# Patient Record
Sex: Female | Born: 1970 | Race: White | Hispanic: No | Marital: Married | Smoking: Never smoker
Health system: Southern US, Community
[De-identification: ages and names within clinical notes are randomized; demographics above are authoritative.]

## PROBLEM LIST (undated history)

## (undated) DIAGNOSIS — E785 Hyperlipidemia, unspecified: Secondary | ICD-10-CM

## (undated) DIAGNOSIS — I10 Essential (primary) hypertension: Secondary | ICD-10-CM

## (undated) HISTORY — PX: NO PAST SURGERIES: SHX2092

---

## 2004-10-25 ENCOUNTER — Ambulatory Visit: Payer: Self-pay | Admitting: Cardiology

## 2012-03-29 ENCOUNTER — Other Ambulatory Visit: Payer: Self-pay | Admitting: Family Medicine

## 2012-03-29 DIAGNOSIS — R928 Other abnormal and inconclusive findings on diagnostic imaging of breast: Secondary | ICD-10-CM

## 2012-04-10 ENCOUNTER — Inpatient Hospital Stay: Admission: RE | Admit: 2012-04-10 | Payer: Self-pay | Source: Ambulatory Visit

## 2012-04-10 ENCOUNTER — Ambulatory Visit
Admission: RE | Admit: 2012-04-10 | Discharge: 2012-04-10 | Disposition: A | Discharge status: Home / Self Care | Payer: BC Managed Care – PPO | Source: Ambulatory Visit | Attending: Family Medicine | Admitting: Family Medicine

## 2012-09-05 ENCOUNTER — Other Ambulatory Visit: Payer: Self-pay

## 2012-09-05 DIAGNOSIS — Z1231 Encounter for screening mammogram for malignant neoplasm of breast: Secondary | ICD-10-CM

## 2012-10-03 ENCOUNTER — Ambulatory Visit
Admission: RE | Admit: 2012-10-03 | Discharge: 2012-10-03 | Disposition: A | Discharge status: Home / Self Care | Payer: BC Managed Care – PPO | Source: Ambulatory Visit

## 2012-10-03 DIAGNOSIS — Z1231 Encounter for screening mammogram for malignant neoplasm of breast: Secondary | ICD-10-CM

## 2013-08-27 ENCOUNTER — Other Ambulatory Visit: Payer: Self-pay

## 2013-08-27 DIAGNOSIS — Z1231 Encounter for screening mammogram for malignant neoplasm of breast: Secondary | ICD-10-CM

## 2013-10-07 ENCOUNTER — Ambulatory Visit
Admission: RE | Admit: 2013-10-07 | Discharge: 2013-10-07 | Disposition: A | Discharge status: Home / Self Care | Payer: BC Managed Care – PPO | Source: Ambulatory Visit

## 2013-10-07 DIAGNOSIS — Z1231 Encounter for screening mammogram for malignant neoplasm of breast: Secondary | ICD-10-CM

## 2014-08-27 ENCOUNTER — Other Ambulatory Visit: Payer: Self-pay

## 2014-08-27 DIAGNOSIS — Z1231 Encounter for screening mammogram for malignant neoplasm of breast: Secondary | ICD-10-CM

## 2014-10-09 ENCOUNTER — Ambulatory Visit: Payer: BC Managed Care – PPO

## 2014-10-09 ENCOUNTER — Ambulatory Visit
Admission: RE | Admit: 2014-10-09 | Discharge: 2014-10-09 | Disposition: A | Discharge status: Home / Self Care | Payer: BC Managed Care – PPO | Source: Ambulatory Visit

## 2014-10-09 DIAGNOSIS — Z1231 Encounter for screening mammogram for malignant neoplasm of breast: Secondary | ICD-10-CM

## 2015-09-24 ENCOUNTER — Other Ambulatory Visit: Payer: Self-pay | Admitting: Obstetrics and Gynecology

## 2015-09-24 DIAGNOSIS — Z1231 Encounter for screening mammogram for malignant neoplasm of breast: Secondary | ICD-10-CM

## 2015-10-12 ENCOUNTER — Ambulatory Visit
Admission: RE | Admit: 2015-10-12 | Discharge: 2015-10-12 | Disposition: A | Discharge status: Home / Self Care | Payer: BC Managed Care – PPO | Source: Ambulatory Visit | Attending: Obstetrics and Gynecology | Admitting: Obstetrics and Gynecology

## 2015-10-12 DIAGNOSIS — Z1231 Encounter for screening mammogram for malignant neoplasm of breast: Secondary | ICD-10-CM

## 2016-09-14 ENCOUNTER — Other Ambulatory Visit: Payer: Self-pay | Admitting: Obstetrics and Gynecology

## 2016-09-14 DIAGNOSIS — Z1231 Encounter for screening mammogram for malignant neoplasm of breast: Secondary | ICD-10-CM

## 2016-10-14 ENCOUNTER — Ambulatory Visit: Payer: BC Managed Care – PPO

## 2016-10-17 ENCOUNTER — Ambulatory Visit
Admission: RE | Admit: 2016-10-17 | Discharge: 2016-10-17 | Disposition: A | Discharge status: Home / Self Care | Payer: BC Managed Care – PPO | Source: Ambulatory Visit | Attending: Obstetrics and Gynecology | Admitting: Obstetrics and Gynecology

## 2016-10-17 DIAGNOSIS — Z1231 Encounter for screening mammogram for malignant neoplasm of breast: Secondary | ICD-10-CM

## 2017-10-02 ENCOUNTER — Other Ambulatory Visit: Payer: Self-pay | Admitting: Obstetrics and Gynecology

## 2017-10-02 DIAGNOSIS — Z1231 Encounter for screening mammogram for malignant neoplasm of breast: Secondary | ICD-10-CM

## 2017-11-06 ENCOUNTER — Ambulatory Visit
Admission: RE | Admit: 2017-11-06 | Discharge: 2017-11-06 | Disposition: A | Discharge status: Home / Self Care | Payer: BC Managed Care – PPO | Source: Ambulatory Visit | Attending: Obstetrics and Gynecology | Admitting: Obstetrics and Gynecology

## 2017-11-06 DIAGNOSIS — Z1231 Encounter for screening mammogram for malignant neoplasm of breast: Secondary | ICD-10-CM

## 2018-11-12 ENCOUNTER — Other Ambulatory Visit: Payer: Self-pay | Admitting: Obstetrics and Gynecology

## 2018-11-12 DIAGNOSIS — Z1231 Encounter for screening mammogram for malignant neoplasm of breast: Secondary | ICD-10-CM

## 2018-11-15 ENCOUNTER — Other Ambulatory Visit: Payer: Self-pay

## 2018-11-15 ENCOUNTER — Ambulatory Visit
Admission: RE | Admit: 2018-11-15 | Discharge: 2018-11-15 | Disposition: A | Discharge status: Home / Self Care | Payer: BC Managed Care – PPO | Source: Ambulatory Visit | Attending: Obstetrics and Gynecology | Admitting: Obstetrics and Gynecology

## 2018-11-15 DIAGNOSIS — Z1231 Encounter for screening mammogram for malignant neoplasm of breast: Secondary | ICD-10-CM

## 2019-11-18 ENCOUNTER — Other Ambulatory Visit: Payer: Self-pay | Admitting: Obstetrics and Gynecology

## 2019-11-18 DIAGNOSIS — Z1231 Encounter for screening mammogram for malignant neoplasm of breast: Secondary | ICD-10-CM

## 2019-12-02 ENCOUNTER — Ambulatory Visit
Admission: RE | Admit: 2019-12-02 | Discharge: 2019-12-02 | Disposition: A | Discharge status: Home / Self Care | Payer: BC Managed Care – PPO | Source: Ambulatory Visit | Attending: Obstetrics and Gynecology | Admitting: Obstetrics and Gynecology

## 2019-12-02 ENCOUNTER — Other Ambulatory Visit: Payer: Self-pay

## 2019-12-02 DIAGNOSIS — Z1231 Encounter for screening mammogram for malignant neoplasm of breast: Secondary | ICD-10-CM

## 2019-12-05 ENCOUNTER — Other Ambulatory Visit: Payer: Self-pay | Admitting: Obstetrics and Gynecology

## 2019-12-05 DIAGNOSIS — R928 Other abnormal and inconclusive findings on diagnostic imaging of breast: Secondary | ICD-10-CM

## 2019-12-19 ENCOUNTER — Ambulatory Visit
Admission: RE | Admit: 2019-12-19 | Discharge: 2019-12-19 | Disposition: A | Discharge status: Home / Self Care | Payer: BC Managed Care – PPO | Source: Ambulatory Visit | Attending: Obstetrics and Gynecology | Admitting: Obstetrics and Gynecology

## 2019-12-19 ENCOUNTER — Other Ambulatory Visit: Payer: Self-pay

## 2019-12-19 DIAGNOSIS — R928 Other abnormal and inconclusive findings on diagnostic imaging of breast: Secondary | ICD-10-CM

## 2020-02-06 LAB — EXTERNAL GENERIC LAB PROCEDURE: COLOGUARD: NEGATIVE

## 2020-02-06 LAB — COLOGUARD: COLOGUARD: NEGATIVE

## 2020-10-23 ENCOUNTER — Other Ambulatory Visit: Payer: Self-pay | Admitting: Obstetrics and Gynecology

## 2020-10-23 DIAGNOSIS — Z1231 Encounter for screening mammogram for malignant neoplasm of breast: Secondary | ICD-10-CM

## 2020-12-02 ENCOUNTER — Other Ambulatory Visit: Payer: Self-pay

## 2020-12-02 ENCOUNTER — Ambulatory Visit
Admission: RE | Admit: 2020-12-02 | Discharge: 2020-12-02 | Disposition: A | Discharge status: Home / Self Care | Payer: BC Managed Care – PPO | Source: Ambulatory Visit | Attending: Obstetrics and Gynecology | Admitting: Obstetrics and Gynecology

## 2020-12-02 DIAGNOSIS — Z1231 Encounter for screening mammogram for malignant neoplasm of breast: Secondary | ICD-10-CM

## 2021-04-28 ENCOUNTER — Encounter (HOSPITAL_COMMUNITY): Payer: Self-pay | Admitting: Emergency Medicine

## 2021-04-28 ENCOUNTER — Other Ambulatory Visit: Payer: Self-pay

## 2021-04-28 ENCOUNTER — Emergency Department (HOSPITAL_COMMUNITY): Payer: BC Managed Care – PPO

## 2021-04-28 ENCOUNTER — Emergency Department (HOSPITAL_COMMUNITY)
Admission: EM | Admit: 2021-04-28 | Discharge: 2021-04-28 | Disposition: A | Discharge status: Home / Self Care | Payer: BC Managed Care – PPO | Attending: Emergency Medicine | Admitting: Emergency Medicine

## 2021-04-28 DIAGNOSIS — R109 Unspecified abdominal pain: Secondary | ICD-10-CM | POA: Diagnosis not present

## 2021-04-28 DIAGNOSIS — I10 Essential (primary) hypertension: Secondary | ICD-10-CM | POA: Diagnosis not present

## 2021-04-28 DIAGNOSIS — K8050 Calculus of bile duct without cholangitis or cholecystitis without obstruction: Secondary | ICD-10-CM

## 2021-04-28 HISTORY — DX: Essential (primary) hypertension: I10

## 2021-04-28 LAB — COMPREHENSIVE METABOLIC PANEL
ALT: 238 U/L — ABNORMAL HIGH (ref 0–44)
AST: 356 U/L — ABNORMAL HIGH (ref 15–41)
Albumin: 4.6 g/dL (ref 3.5–5.0)
Alkaline Phosphatase: 108 U/L (ref 38–126)
Anion gap: 10 (ref 5–15)
BUN: 16 mg/dL (ref 6–20)
CO2: 26 mmol/L (ref 22–32)
Calcium: 9.3 mg/dL (ref 8.9–10.3)
Chloride: 103 mmol/L (ref 98–111)
Creatinine, Ser: 0.71 mg/dL (ref 0.44–1.00)
GFR, Estimated: >60 mL/min (ref >60)
Glucose, Bld: 104 mg/dL — ABNORMAL HIGH (ref 70–99)
Potassium: 3.6 mmol/L (ref 3.5–5.1)
Sodium: 139 mmol/L (ref 135–145)
Total Bilirubin: 1 mg/dL (ref 0.3–1.2)
Total Protein: 7.4 g/dL (ref 6.5–8.1)

## 2021-04-28 LAB — CBC
HCT: 43.1 % (ref 36.0–46.0)
Hemoglobin: 14.1 g/dL (ref 12.0–15.0)
MCH: 31.5 pg (ref 26.0–34.0)
MCHC: 32.7 g/dL (ref 30.0–36.0)
MCV: 96.2 fL (ref 80.0–100.0)
Platelets: 192 10*3/uL (ref 150–400)
RBC: 4.48 MIL/uL (ref 3.87–5.11)
RDW: 11.5 % (ref 11.5–15.5)
WBC: 10.5 10*3/uL (ref 4.0–10.5)
nRBC: 0 % (ref 0.0–0.2)

## 2021-04-28 LAB — URINALYSIS, ROUTINE W REFLEX MICROSCOPIC
Bilirubin Urine: NEGATIVE
Glucose, UA: NEGATIVE mg/dL
Hgb urine dipstick: NEGATIVE
Ketones, ur: 20 mg/dL — AB
Nitrite: NEGATIVE
Protein, ur: 30 mg/dL — AB
Specific Gravity, Urine: 1.025 (ref 1.005–1.030)
pH: 6 (ref 5.0–8.0)

## 2021-04-28 LAB — LIPASE, BLOOD: Lipase: 39 U/L (ref 11–51)

## 2021-04-28 LAB — PREGNANCY, URINE: Preg Test, Ur: NEGATIVE

## 2021-04-28 NOTE — Discharge Instructions (Signed)
Please read the attached instructions, make sure that you follow-up closely with your doctor and the general surgeon.  Your ultrasound did show that you had a gallstone inside your gallbladder which was probably causing the problem.  It is not causing a problem at this time however if you increase your pain or vomiting after a meal or if it comes on spontaneously return to the ER immediately if it is not going away within a couple of hours ?

## 2021-04-28 NOTE — ED Triage Notes (Signed)
Pt c/o upper abdominal pain that is starting to ease up now. Denies n/v/d ?

## 2021-04-28 NOTE — ED Provider Notes (Signed)
?[facility] ?Provider Note ? ? ?CSN: [id number] ?Arrival date & time: 04/28/21  0141 ? ?  ? ?History ?Chief Complaint  ?Patient presents with  ? Abdominal Pain  ? ? ?Debra Blanchard is a 51 y.o. female. ? ?51 year old female who presents to the emerged from today with abdominal pain.  Patient has history of hyperlipidemia hypertension but no other real medical problems.  She states that she has woken her this morning just prior to arrival with severe burning in her epigastric and bilateral upper quadrants.  This persisted for quite a period of time.  No nausea or vomiting.  No fevers.  No diarrhea or constipation.  She states that she tried to move around it did not help she was almost doubled over that her so bad.  She stated by time she got here and it almost resolved.  While in the waiting room she stated came back but not quite as severe and very quickly and then went away.  She states that she is never relating this severe before.  She had some indigestion that feels nothing like this.  She states that her liver enzymes have been elevated a little bit in the past but she is not sure the numbers.  (Patient was able to get the results sent to her by her son at home and her ALT was slightly elevated at 65 but her AST was normal.) ?No alcohol, drugs or tobacco.  No history of hepatitis, cirrhosis or any other known hepatobiliary disorders. ? ? ?Abdominal Pain ? ?  ? ?Home Medications ?Prior to Admission medications   ?Not on File  ?   ? ?Allergies    ?Patient has no allergy information on record.   ? ?Review of Systems   ?Review of Systems  ?Gastrointestinal:  Positive for abdominal pain.  ? ?Physical Exam ?Updated Vital Signs ?BP 125/80   Pulse (!) 59   Temp 97.6 ?F (36.4 ?C) (Oral)   Resp 18   Ht 5\' 3"  (1.6 m)   Wt 78.9 kg   SpO2 99%   BMI 30.82 kg/m?  ?Physical Exam ?Vitals and nursing note reviewed.  ?Constitutional:   ?   Appearance: She is well-developed.  ?HENT:  ?   Head:  Normocephalic and atraumatic.  ?   Mouth/Throat:  ?   Mouth: Mucous membranes are moist.  ?   Pharynx: Oropharynx is clear.  ?Eyes:  ?   Pupils: Pupils are equal, round, and reactive to light.  ?Cardiovascular:  ?   Rate and Rhythm: Normal rate and regular rhythm.  ?Pulmonary:  ?   Effort: No respiratory distress.  ?   Breath sounds: No stridor.  ?Abdominal:  ?   General: Abdomen is flat. There is no distension.  ?   Tenderness: There is no abdominal tenderness. There is no rebound.  ?   Hernia: No hernia is present.  ?Musculoskeletal:  ?   Cervical back: Normal range of motion.  ?Skin: ?   General: Skin is warm and dry.  ?Neurological:  ?   General: No focal deficit present.  ?   Mental Status: She is alert.  ? ? ?ED Results / Procedures / Treatments   ?Labs ?(all labs ordered are listed, but only abnormal results are displayed) ?Labs Reviewed  ?COMPREHENSIVE METABOLIC PANEL - Abnormal; Notable for the following components:  ?    Result Value  ? Glucose, Bld 104 (*)   ? AST 356 (*)   ? ALT 238 (*)   ?  All other components within normal limits  ?URINALYSIS, ROUTINE W REFLEX MICROSCOPIC - Abnormal; Notable for the following components:  ? Ketones, ur 20 (*)   ? Protein, ur 30 (*)   ? Leukocytes,Ua SMALL (*)   ? Bacteria, UA RARE (*)   ? All other components within normal limits  ?LIPASE, BLOOD  ?CBC  ?PREGNANCY, URINE  ?HEPATITIS PANEL, ACUTE  ? ? ?EKG ?None ? ?Radiology ?No results found. ? ?Procedures ?Procedures  ? ? ?Medications Ordered in ED ?Medications - No data to display ? ?ED Course/ Medical Decision Making/ A&P ?  ?                        ?Medical Decision Making ?Amount and/or Complexity of Data Reviewed ?Labs: ordered. ?Radiology: ordered. ? ? ?Liver enzymes are quite a bit elevated compared to her baseline.  She is asymptomatic at this time however I am worried little bit about her gallbladder and liver so we will get an ultrasound. ? ?Care transferred pendign [place]. Likely d/c ?  ?Final Clinical  Impression(s) / ED Diagnoses ?Final diagnoses:  ?Abdominal pain  ? ? ?Rx / DC Orders ?ED Discharge Orders   ? ? None  ? ?  ? ? ?  ?Merrily Pew, MD ?04/28/21 [phone] ? ?

## 2021-04-28 NOTE — ED Provider Notes (Signed)
This patient is a well-appearing 51 year old female, no prior history of significant liver abnormalities however she presents with upper abdominal pain which came on acutely overnight, since has completely resolved and she is asymptomatic at this time.  Her laboratory work-up however according to my evaluation and interpretation personally shows that she has a transaminitis of an unknown cause, her abdominal exam is completely benign for me and she has totally no tenderness at all.  Ultrasound is pending at this time, hepatitis panel is pending, labs are otherwise reassuring, I anticipate discharge. ? ?The patient has been pain-free, there is evidence of cholelithiasis but no cholecystitis, there is some gallbladder sludge, the patient will be referred to local general surgery, she is agreeable ?  ?Eber Hong, MD ?04/28/21 [phone] ? ?

## 2021-04-29 LAB — HEPATITIS PANEL, ACUTE

## 2021-04-29 LAB — HCV INTERPRETATION

## 2021-05-11 ENCOUNTER — Ambulatory Visit: Payer: BC Managed Care – PPO | Admitting: General Surgery

## 2021-05-11 ENCOUNTER — Other Ambulatory Visit: Payer: Self-pay

## 2021-05-11 ENCOUNTER — Encounter: Payer: Self-pay | Admitting: General Surgery

## 2021-05-11 VITALS — BP 147/87 | HR 48 | Temp 98.2°F | Resp 12 | Ht 63.0 in | Wt 170.0 lb

## 2021-05-11 DIAGNOSIS — K8000 Calculus of gallbladder with acute cholecystitis without obstruction: Secondary | ICD-10-CM

## 2021-05-11 NOTE — H&P (Signed)
Debra Blanchard; [id number]; 10/30/1970 ? ? ?HPI ?Patient is a 51-year-old white female who was referred to my care by Dr. Terry Daniel in the emergency room for evaluation and treatment of acute cholecystitis secondary to cholelithiasis.  Patient was seen in the emergency room on 04/28/2021 for right upper quadrant abdominal pain and nausea.  Ultrasound the gallbladder revealed cholelithiasis, the 7 mm gallbladder polyp, and pericholecystic fluid.  Her common bile duct was 7 mm.  Her liver enzyme tests were noted to be elevated in the 2-3 100s.  Patient states that she was recently switched her high cholesterol medication due to mild elevation in her LFTs.  She states she has had intermittent right upper quadrant abdominal pain over the past few months.  Is not necessarily associated with food.  She currently has been mild indigestion and right upper quadrant discomfort which is variable.  She denies any fever, chills, or jaundice. ?Past Medical History:  ?Diagnosis Date  ? Hypertension   ? ? ?History reviewed. No pertinent surgical history. ? ?Family History  ?Problem Relation Age of Onset  ? Breast cancer Neg Hx   ? ? ?Current Outpatient Medications on File Prior to Visit  ?Medication Sig Dispense Refill  ? ezetimibe (ZETIA) 10 MG tablet Take 10 mg by mouth daily.    ? metoprolol succinate (TOPROL-XL) 50 MG 24 hr tablet Take by mouth.    ? ?No current facility-administered medications on file prior to visit.  ? ? ?No Known Allergies ? ?Social History  ? ?Substance and Sexual Activity  ?Alcohol Use Never  ? ? ?Social History  ? ?Tobacco Use  ?Smoking Status Never  ?Smokeless Tobacco Never  ? ? ?Review of Systems  ?Constitutional: Negative.   ?HENT: Negative.    ?Eyes:  Positive for pain.  ?Gastrointestinal:  Positive for abdominal pain.  ?Genitourinary: Negative.   ?Musculoskeletal:  Positive for back pain.  ?Skin: Negative.   ?Neurological: Negative.   ?Endo/Heme/Allergies: Negative.   ?Psychiatric/Behavioral:  Negative.    ? ?Objective  ? ?Vitals:  ? 05/11/21 1535  ?BP: (!) 147/87  ?Pulse: (!) 48  ?Resp: 12  ?Temp: 98.2 ?F (36.8 ?C)  ?SpO2: 97%  ? ? ?Physical Exam ?Vitals reviewed.  ?Constitutional:   ?   Appearance: Normal appearance. She is not ill-appearing.  ?HENT:  ?   Head: Normocephalic and atraumatic.  ?Eyes:  ?   General: No scleral icterus. ?Cardiovascular:  ?   Rate and Rhythm: Normal rate and regular rhythm.  ?   Heart sounds: Normal heart sounds. No murmur heard. ?  No friction rub. No gallop.  ?Pulmonary:  ?   Effort: Pulmonary effort is normal. No respiratory distress.  ?   Breath sounds: No stridor. No wheezing, rhonchi or rales.  ?Abdominal:  ?   General: Bowel sounds are normal. There is no distension.  ?   Palpations: Abdomen is soft. There is no mass.  ?   Tenderness: There is abdominal tenderness. There is no guarding or rebound.  ?   Hernia: No hernia is present.  ?   Comments: Tender in the right upper quadrant to deep palpation.  No rigidity is noted.  ?Skin: ?   General: Skin is warm and dry.  ?Neurological:  ?   Mental Status: She is alert and oriented to person, place, and time.  ? ?ER notes, ultrasound report, and labs reviewed ?Assessment  ?Acute cholecystitis secondary to cholelithiasis ?Plan  ?Patient is scheduled for laparoscopic cholecystectomy on 05/17/2021.  The   risks and benefits of the procedure including bleeding, infection, hepatobiliary injury, and the possibility of an open procedure were fully explained to the patient, who gave informed consent. ?

## 2021-05-11 NOTE — Progress Notes (Signed)
Debra Blanchard; [id number]; 1970-03-07 ? ? ?HPI ?Patient is a 51 year old white female who was referred to my care by Dr. Donzetta Sprung in the emergency room for evaluation and treatment of acute cholecystitis secondary to cholelithiasis.  Patient was seen in the emergency room on 04/28/2021 for right upper quadrant abdominal pain and nausea.  Ultrasound the gallbladder revealed cholelithiasis, the 7 mm gallbladder polyp, and pericholecystic fluid.  Her common bile duct was 7 mm.  Her liver enzyme tests were noted to be elevated in the 2-3 100s.  Patient states that she was recently switched her high cholesterol medication due to mild elevation in her LFTs.  She states she has had intermittent right upper quadrant abdominal pain over the past few months.  Is not necessarily associated with food.  She currently has been mild indigestion and right upper quadrant discomfort which is variable.  She denies any fever, chills, or jaundice. ?Past Medical History:  ?Diagnosis Date  ? Hypertension   ? ? ?History reviewed. No pertinent surgical history. ? ?Family History  ?Problem Relation Age of Onset  ? Breast cancer Neg Hx   ? ? ?Current Outpatient Medications on File Prior to Visit  ?Medication Sig Dispense Refill  ? ezetimibe (ZETIA) 10 MG tablet Take 10 mg by mouth daily.    ? metoprolol succinate (TOPROL-XL) 50 MG 24 hr tablet Take by mouth.    ? ?No current facility-administered medications on file prior to visit.  ? ? ?No Known Allergies ? ?Social History  ? ?Substance and Sexual Activity  ?Alcohol Use Never  ? ? ?Social History  ? ?Tobacco Use  ?Smoking Status Never  ?Smokeless Tobacco Never  ? ? ?Review of Systems  ?Constitutional: Negative.   ?HENT: Negative.    ?Eyes:  Positive for pain.  ?Gastrointestinal:  Positive for abdominal pain.  ?Genitourinary: Negative.   ?Musculoskeletal:  Positive for back pain.  ?Skin: Negative.   ?Neurological: Negative.   ?Endo/Heme/Allergies: Negative.   ?Psychiatric/Behavioral:  Negative.    ? ?Objective  ? ?Vitals:  ? 05/11/21 1535  ?BP: (!) 147/87  ?Pulse: (!) 48  ?Resp: 12  ?Temp: 98.2 ?F (36.8 ?C)  ?SpO2: 97%  ? ? ?Physical Exam ?Vitals reviewed.  ?Constitutional:   ?   Appearance: Normal appearance. She is not ill-appearing.  ?HENT:  ?   Head: Normocephalic and atraumatic.  ?Eyes:  ?   General: No scleral icterus. ?Cardiovascular:  ?   Rate and Rhythm: Normal rate and regular rhythm.  ?   Heart sounds: Normal heart sounds. No murmur heard. ?  No friction rub. No gallop.  ?Pulmonary:  ?   Effort: Pulmonary effort is normal. No respiratory distress.  ?   Breath sounds: No stridor. No wheezing, rhonchi or rales.  ?Abdominal:  ?   General: Bowel sounds are normal. There is no distension.  ?   Palpations: Abdomen is soft. There is no mass.  ?   Tenderness: There is abdominal tenderness. There is no guarding or rebound.  ?   Hernia: No hernia is present.  ?   Comments: Tender in the right upper quadrant to deep palpation.  No rigidity is noted.  ?Skin: ?   General: Skin is warm and dry.  ?Neurological:  ?   Mental Status: She is alert and oriented to person, place, and time.  ? ?ER notes, ultrasound report, and labs reviewed ?Assessment  ?Acute cholecystitis secondary to cholelithiasis ?Plan  ?Patient is scheduled for laparoscopic cholecystectomy on 05/17/2021.  The  risks and benefits of the procedure including bleeding, infection, hepatobiliary injury, and the possibility of an open procedure were fully explained to the patient, who gave informed consent. ?

## 2021-05-12 ENCOUNTER — Encounter (HOSPITAL_COMMUNITY): Payer: Self-pay

## 2021-05-12 NOTE — Patient Instructions (Signed)
? ? ASHELI MATYAS ? 05/12/2021  ?  ? @PREFPERIOPPHARMACY @ ? ? Your procedure is scheduled on 05/17/2021. ? Report to [facility] at 6:00 A.M. ? Call this number if you have problems the morning of surgery: ? [phone] ? ? Remember: ? Do not eat or drink after midnight. ?  ?  ? Take these medicines the morning of surgery with A SIP OF WATER : Metoprolol ?  ? Do not wear jewelry, make-up or nail polish. ? Do not wear lotions, powders, or perfumes, or deodorant. ? Do not shave 48 hours prior to surgery.  Men may shave face and neck. ? Do not bring valuables to the hospital. ? [facility] is not responsible for any belongings or valuables. ? ?Contacts, dentures or bridgework may not be worn into surgery.  Leave your suitcase in the car.  After surgery it may be brought to your room. ? ?For patients admitted to the hospital, discharge time will be determined by your treatment team. ? ?Patients discharged the day of surgery will not be allowed to drive home.  ? ?Name and phone number of your driver:   Family ?Special instructions:  N/A ? ?Please read over the following fact sheets that you were given. ?Care and Recovery After Surgery ? ? ? ?Minimally Invasive Cholecystectomy ?Minimally invasive cholecystectomy is surgery to remove the gallbladder. The gallbladder is a pear-shaped organ that lies beneath the liver on the right side of the body. The gallbladder stores bile, which is a fluid that helps the body digest fats. Cholecystectomy is often done to treat inflammation (irritation and swelling) of the gallbladder (cholecystitis). This condition is usually caused by a buildup of gallstones (cholelithiasis) in the gallbladder or when the fluid in the gall bladder becomes stagnant because gallstones get stuck in the ducts (tubes) and block the flow of bile. This can result in inflammation and pain. In severe cases, emergency surgery may be required. ?This procedure is done through small incisions in the abdomen,  instead of one large incision. It is also called laparoscopic surgery. A thin scope with a camera (laparoscope) is inserted through one incision. Then surgical instruments are inserted through the other incisions. In some cases, a minimally invasive surgery may need to be changed to a surgery that is done through a larger incision. This is called open surgery. ?Tell a health care provider about: ?Any allergies you have. ?All medicines you are taking, including vitamins, herbs, eye drops, creams, and over-the-counter medicines. ?Any problems you or family members have had with anesthetic medicines. ?Any bleeding problems you have. ?Any surgeries you have had. ?Any medical conditions you have. ?Whether you are pregnant or may be pregnant. ?What are the risks? ?Generally, this is a safe procedure. However, problems may occur, including: ?Infection. ?Bleeding. ?Allergic reactions to medicines. ?Damage to nearby structures or organs. ?A gallstone remaining in the common bile duct. The common bile duct carries bile from the gallbladder to the small intestine. ?A bile leak from the liver or cystic duct after your gallbladder is removed. ?What happens before the procedure? ?When to stop eating and drinking ?Follow instructions from your health care provider about what you may eat and drink before your procedure. These may include: ?8 hours before the procedure ?Stop eating most foods. Do not eat meat, fried foods, or fatty foods. ?Eat only light foods, such as toast or crackers. ?All liquids are okay except energy drinks and alcohol. ?6 hours before the procedure ?Stop eating. ?Drink only clear  liquids, such as water, clear fruit juice, black coffee, plain tea, and sports drinks. ?Do not drink energy drinks or alcohol. ?2 hours before the procedure ?Stop drinking all liquids. ?You may be allowed to take medicines with small sips of water. ?If you do not follow your health care provider's instructions, your procedure may be  delayed or canceled. ?Medicines ?Ask your health care provider about: ?Changing or stopping your regular medicines. This is especially important if you are taking diabetes medicines or blood thinners. ?Taking medicines such as aspirin and ibuprofen. These medicines can thin your blood. Do not take these medicines unless your health care provider tells you to take them. ?Taking over-the-counter medicines, vitamins, herbs, and supplements. ?General instructions ?If you will be going home right after the procedure, plan to have a responsible adult: ?Take you home from the hospital or clinic. You will not be allowed to drive. ?Care for you for the time you are told. ?Do not use any products that contain nicotine or tobacco for at least 4 weeks before the procedure. These products include cigarettes, chewing tobacco, and vaping devices, such as e-cigarettes. If you need help quitting, ask your health care provider. ?Ask your health care provider: ?How your surgery site will be marked. ?What steps will be taken to help prevent infection. These may include: ?Removing hair at the surgery site. ?Washing skin with a germ-killing soap. ?Taking antibiotic medicine. ?What happens during the procedure? ? ?An IV will be inserted into one of your veins. ?You will be given one or both of the following: ?A medicine to help you relax (sedative). ?A medicine to make you fall asleep (general anesthetic). ?Your surgeon will make several small incisions in your abdomen. ?The laparoscope will be inserted through one of the small incisions. The camera on the laparoscope will send images to a monitor in the operating room. This lets your surgeon see inside your abdomen. ?A gas will be pumped into your abdomen. This will expand your abdomen to give the surgeon more room to perform the surgery. ?Other tools that are needed for the procedure will be inserted through the other incisions. The gallbladder will be removed through one of the  incisions. ?Your common bile duct may be examined. If stones are found in the common bile duct, they may be removed. ?After your gallbladder has been removed, the incisions will be closed with stitches (sutures), staples, or skin glue. ?Your incisions will be covered with a bandage (dressing). ?The procedure may vary among health care providers and hospitals. ?What happens after the procedure? ?Your blood pressure, heart rate, breathing rate, and blood oxygen level will be monitored until you leave the hospital or clinic. ?You will be given medicines as needed to control your pain. ?You may have a drain placed in the incision. The drain will be removed a day or two after the procedure. ?Summary ?Minimally invasive cholecystectomy, also called laparoscopic cholecystectomy, is surgery to remove the gallbladder using small incisions. ?Tell your health care provider about all the medical conditions you have and all the medicines you are taking for those conditions. ?Before the procedure, follow instructions about when to stop eating and drinking and changing or stopping medicines. ?Plan to have a responsible adult care for you for the time you are told after you leave the hospital or clinic. ?This information is not intended to replace advice given to you by your health care provider. Make sure you discuss any questions you have with your health care provider. ?Document  Revised: 08/18/2020 Document Reviewed: 08/18/2020 ?Elsevier Patient Education ? [place]. ? ?General Anesthesia, Adult ?General anesthesia is the use of medicines to make a person "go to sleep" (unconscious) for a medical procedure. General anesthesia must be used for certain procedures, and is often recommended for procedures that: ?Last a long time. ?Require you to be still or in an unusual position. ?Are major and can cause blood loss. ?The medicines used for general anesthesia are called general anesthetics. As well as making you unconscious  for a certain amount of time, these medicines: ?Prevent pain. ?Control your blood pressure. ?Relax your muscles. ?Tell a health care provider about: ?Any allergies you have. ?All medicines you are taking, inclu

## 2021-05-13 ENCOUNTER — Encounter (HOSPITAL_COMMUNITY)
Admission: RE | Admit: 2021-05-13 | Discharge: 2021-05-13 | Disposition: A | Discharge status: Home / Self Care | Payer: BC Managed Care – PPO | Source: Ambulatory Visit | Attending: General Surgery | Admitting: General Surgery

## 2021-05-13 ENCOUNTER — Encounter (HOSPITAL_COMMUNITY): Payer: Self-pay | Admitting: *Deleted

## 2021-05-13 HISTORY — DX: Hyperlipidemia, unspecified: E78.5

## 2021-05-17 ENCOUNTER — Other Ambulatory Visit: Payer: Self-pay

## 2021-05-17 ENCOUNTER — Encounter (HOSPITAL_COMMUNITY): Payer: Self-pay | Admitting: General Surgery

## 2021-05-17 ENCOUNTER — Encounter (HOSPITAL_COMMUNITY)
Admission: RE | Disposition: A | Discharge status: Home / Self Care | Payer: Self-pay | Source: Home / Self Care | Attending: General Surgery

## 2021-05-17 ENCOUNTER — Ambulatory Visit (HOSPITAL_COMMUNITY): Payer: BC Managed Care – PPO | Admitting: Anesthesiology

## 2021-05-17 ENCOUNTER — Ambulatory Visit (HOSPITAL_COMMUNITY)
Admission: RE | Admit: 2021-05-17 | Discharge: 2021-05-17 | Disposition: A | Discharge status: Home / Self Care | Payer: BC Managed Care – PPO | Attending: General Surgery | Admitting: General Surgery

## 2021-05-17 DIAGNOSIS — I10 Essential (primary) hypertension: Secondary | ICD-10-CM | POA: Insufficient documentation

## 2021-05-17 DIAGNOSIS — K8012 Calculus of gallbladder with acute and chronic cholecystitis without obstruction: Secondary | ICD-10-CM | POA: Insufficient documentation

## 2021-05-17 DIAGNOSIS — K8020 Calculus of gallbladder without cholecystitis without obstruction: Secondary | ICD-10-CM

## 2021-05-17 DIAGNOSIS — E7800 Pure hypercholesterolemia, unspecified: Secondary | ICD-10-CM | POA: Insufficient documentation

## 2021-05-17 DIAGNOSIS — R7401 Elevation of levels of liver transaminase levels: Secondary | ICD-10-CM | POA: Diagnosis not present

## 2021-05-17 HISTORY — PX: LIVER BIOPSY: SHX301

## 2021-05-17 HISTORY — PX: CHOLECYSTECTOMY: SHX55

## 2021-05-17 SURGERY — LAPAROSCOPIC CHOLECYSTECTOMY
Anesthesia: General | Site: Abdomen

## 2021-05-17 MED ORDER — LACTATED RINGERS IV SOLN: INTRAVENOUS | Status: DC

## 2021-05-17 MED ORDER — LIDOCAINE HCL (PF) 2 % IJ SOLN
INTRAMUSCULAR | Status: AC
  Filled 2021-05-17: qty 5

## 2021-05-17 MED ORDER — ORAL CARE MOUTH RINSE: 15.0000 mL | Freq: Once | OROMUCOSAL | Status: AC

## 2021-05-17 MED ORDER — MIDAZOLAM HCL 2 MG/2ML IJ SOLN
INTRAMUSCULAR | Status: DC | PRN
Start: 2021-05-17 — End: 2021-05-17
  Administered 2021-05-17: 2 mg via INTRAVENOUS

## 2021-05-17 MED ORDER — ONDANSETRON HCL 4 MG/2ML IJ SOLN
INTRAMUSCULAR | Status: AC
  Filled 2021-05-17: qty 2

## 2021-05-17 MED ORDER — ROCURONIUM BROMIDE 10 MG/ML (PF) SYRINGE
PREFILLED_SYRINGE | INTRAVENOUS | Status: AC
  Filled 2021-05-17: qty 10

## 2021-05-17 MED ORDER — KETOROLAC TROMETHAMINE 30 MG/ML IJ SOLN
30.0000 mg | Freq: Once | INTRAMUSCULAR | Status: AC
  Administered 2021-05-17: 30 mg via INTRAVENOUS
  Filled 2021-05-17: qty 1

## 2021-05-17 MED ORDER — HEMOSTATIC AGENTS (NO CHARGE) OPTIME
TOPICAL | Status: DC | PRN
Start: 2021-05-17 — End: 2021-05-17
  Administered 2021-05-17: 1 via TOPICAL

## 2021-05-17 MED ORDER — HYDROMORPHONE HCL 1 MG/ML IJ SOLN
0.2500 mg | INTRAMUSCULAR | Status: DC | PRN
  Administered 2021-05-17: 0.5 mg via INTRAVENOUS
  Filled 2021-05-17: qty 0.5

## 2021-05-17 MED ORDER — BUPIVACAINE LIPOSOME 1.3 % IJ SUSP
INTRAMUSCULAR | Status: DC | PRN
  Administered 2021-05-17: 20 mL

## 2021-05-17 MED ORDER — SODIUM CHLORIDE 0.9 % IR SOLN
Status: DC | PRN
  Administered 2021-05-17: 1000 mL

## 2021-05-17 MED ORDER — SCOPOLAMINE 1 MG/3DAYS TD PT72
MEDICATED_PATCH | TRANSDERMAL | Status: AC
  Filled 2021-05-17: qty 1

## 2021-05-17 MED ORDER — CHLORHEXIDINE GLUCONATE CLOTH 2 % EX PADS: 6.0000 | MEDICATED_PAD | Freq: Once | CUTANEOUS | Status: DC

## 2021-05-17 MED ORDER — PROPOFOL 10 MG/ML IV BOLUS
INTRAVENOUS | Status: AC
  Filled 2021-05-17: qty 20

## 2021-05-17 MED ORDER — LIDOCAINE HCL (CARDIAC) PF 50 MG/5ML IV SOSY
PREFILLED_SYRINGE | INTRAVENOUS | Status: DC | PRN
Start: 2021-05-17 — End: 2021-05-17
  Administered 2021-05-17: 80 mg via INTRAVENOUS

## 2021-05-17 MED ORDER — GLYCOPYRROLATE PF 0.2 MG/ML IJ SOSY
PREFILLED_SYRINGE | INTRAMUSCULAR | Status: AC
  Filled 2021-05-17: qty 1

## 2021-05-17 MED ORDER — MEPERIDINE HCL 50 MG/ML IJ SOLN: 6.2500 mg | INTRAMUSCULAR | Status: DC | PRN

## 2021-05-17 MED ORDER — OXYCODONE HCL 5 MG PO TABS: 5.0000 mg | ORAL_TABLET | ORAL | 0 refills | Status: DC | PRN

## 2021-05-17 MED ORDER — CEFAZOLIN SODIUM-DEXTROSE 2-4 GM/100ML-% IV SOLN
2.0000 g | INTRAVENOUS | Status: AC
  Administered 2021-05-17: 2 g via INTRAVENOUS

## 2021-05-17 MED ORDER — CHLORHEXIDINE GLUCONATE 0.12 % MT SOLN
15.0000 mL | Freq: Once | OROMUCOSAL | Status: AC
  Administered 2021-05-17: 15 mL via OROMUCOSAL

## 2021-05-17 MED ORDER — ONDANSETRON HCL 4 MG/2ML IJ SOLN
INTRAMUSCULAR | Status: DC | PRN
  Administered 2021-05-17: 4 mg via INTRAVENOUS

## 2021-05-17 MED ORDER — MIDAZOLAM HCL 2 MG/2ML IJ SOLN
INTRAMUSCULAR | Status: AC
  Filled 2021-05-17: qty 2

## 2021-05-17 MED ORDER — ONDANSETRON HCL 4 MG/2ML IJ SOLN: 4.0000 mg | Freq: Once | INTRAMUSCULAR | Status: DC | PRN

## 2021-05-17 MED ORDER — DEXAMETHASONE SODIUM PHOSPHATE 10 MG/ML IJ SOLN
INTRAMUSCULAR | Status: DC | PRN
Start: 2021-05-17 — End: 2021-05-17
  Administered 2021-05-17: 5 mg via INTRAVENOUS

## 2021-05-17 MED ORDER — CEFAZOLIN SODIUM-DEXTROSE 2-4 GM/100ML-% IV SOLN
INTRAVENOUS | Status: AC
  Filled 2021-05-17: qty 100

## 2021-05-17 MED ORDER — FENTANYL CITRATE (PF) 250 MCG/5ML IJ SOLN
INTRAMUSCULAR | Status: AC
  Filled 2021-05-17: qty 5

## 2021-05-17 MED ORDER — SUGAMMADEX SODIUM 500 MG/5ML IV SOLN
INTRAVENOUS | Status: DC | PRN
Start: 2021-05-17 — End: 2021-05-17
  Administered 2021-05-17: 200 mg via INTRAVENOUS

## 2021-05-17 MED ORDER — DEXAMETHASONE SODIUM PHOSPHATE 10 MG/ML IJ SOLN
INTRAMUSCULAR | Status: AC
  Filled 2021-05-17: qty 1

## 2021-05-17 MED ORDER — SCOPOLAMINE 1 MG/3DAYS TD PT72
1.0000 | MEDICATED_PATCH | Freq: Once | TRANSDERMAL | Status: DC
  Administered 2021-05-17: 1.5 mg via TRANSDERMAL

## 2021-05-17 MED ORDER — FENTANYL CITRATE (PF) 100 MCG/2ML IJ SOLN
INTRAMUSCULAR | Status: DC | PRN
  Administered 2021-05-17 (×3): 50 ug via INTRAVENOUS

## 2021-05-17 MED ORDER — ROCURONIUM BROMIDE 100 MG/10ML IV SOLN
INTRAVENOUS | Status: DC | PRN
  Administered 2021-05-17: 50 mg via INTRAVENOUS

## 2021-05-17 MED ORDER — GLYCOPYRROLATE 0.2 MG/ML IJ SOLN
INTRAMUSCULAR | Status: DC | PRN
  Administered 2021-05-17: .2 mg via INTRAVENOUS

## 2021-05-17 MED ORDER — PROPOFOL 10 MG/ML IV BOLUS
INTRAVENOUS | Status: DC | PRN
Start: 2021-05-17 — End: 2021-05-17
  Administered 2021-05-17: 150 mg via INTRAVENOUS

## 2021-05-17 MED ORDER — BUPIVACAINE LIPOSOME 1.3 % IJ SUSP
INTRAMUSCULAR | Status: AC
  Filled 2021-05-17: qty 20

## 2021-05-17 SURGICAL SUPPLY — 51 items
ADH SKN CLS APL DERMABOND .7 (GAUZE/BANDAGES/DRESSINGS) ×1
APL PRP STRL LF DISP 70% ISPRP (MISCELLANEOUS) ×1
APL SRG 38 LTWT LNG FL B (MISCELLANEOUS)
APPLICATOR ARISTA FLEXITIP XL (MISCELLANEOUS) IMPLANT
APPLIER CLIP ROT 10 11.4 M/L (STAPLE) ×2
APR CLP MED LRG 11.4X10 (STAPLE) ×1
BAG RETRIEVAL 10 (BASKET) ×1
CHLORAPREP W/TINT 26 (MISCELLANEOUS) ×2 IMPLANT
CLIP APPLIE ROT 10 11.4 M/L (STAPLE) ×1 IMPLANT
CLOTH BEACON ORANGE TIMEOUT ST (SAFETY) ×2 IMPLANT
COVER LIGHT HANDLE STERIS (MISCELLANEOUS) ×4 IMPLANT
DERMABOND ADVANCED (GAUZE/BANDAGES/DRESSINGS) ×1
DERMABOND ADVANCED .7 DNX12 (GAUZE/BANDAGES/DRESSINGS) ×1 IMPLANT
ELECT REM PT RETURN 9FT ADLT (ELECTROSURGICAL) ×2
ELECTRODE REM PT RTRN 9FT ADLT (ELECTROSURGICAL) ×1 IMPLANT
GAUZE 4X4 16PLY ~~LOC~~+RFID DBL (SPONGE) ×2 IMPLANT
GLOVE SURG LTX SZ6.5 (GLOVE) ×1 IMPLANT
GLOVE SURG POLYISO LF SZ6.5 (GLOVE) ×1 IMPLANT
GLOVE SURG POLYISO LF SZ7.5 (GLOVE) ×2 IMPLANT
GLOVE SURG UNDER POLY LF SZ7 (GLOVE) ×5 IMPLANT
GOWN STRL REUS W/TWL LRG LVL3 (GOWN DISPOSABLE) ×8 IMPLANT
HEMOSTAT ARISTA ABSORB 3G PWDR (HEMOSTASIS) IMPLANT
HEMOSTAT SNOW SURGICEL 2X4 (HEMOSTASIS) ×2 IMPLANT
INST SET LAPROSCOPIC AP (KITS) ×2 IMPLANT
KIT TURNOVER KIT A (KITS) ×2 IMPLANT
MANIFOLD NEPTUNE II (INSTRUMENTS) ×2 IMPLANT
NDL BIOPSY 14GX4.5 SOFT TIS (NEEDLE) IMPLANT
NDL HYPO 18GX1.5 BLUNT FILL (NEEDLE) ×1 IMPLANT
NDL HYPO 21X1.5 SAFETY (NEEDLE) ×1 IMPLANT
NDL INSUFFLATION 14GA 120MM (NEEDLE) ×1 IMPLANT
NEEDLE BIOPSY 14GX4.5 SOFT TIS (NEEDLE) ×2 IMPLANT
NEEDLE HYPO 18GX1.5 BLUNT FILL (NEEDLE) ×2 IMPLANT
NEEDLE HYPO 21X1.5 SAFETY (NEEDLE) ×2 IMPLANT
NEEDLE INSUFFLATION 14GA 120MM (NEEDLE) ×2 IMPLANT
NS IRRIG 1000ML POUR BTL (IV SOLUTION) ×2 IMPLANT
PACK LAP CHOLE LZT030E (CUSTOM PROCEDURE TRAY) ×2 IMPLANT
PAD ARMBOARD 7.5X6 YLW CONV (MISCELLANEOUS) ×2 IMPLANT
PAD TELFA 3X4 1S STER (GAUZE/BANDAGES/DRESSINGS) ×1 IMPLANT
SET BASIN LINEN APH (SET/KITS/TRAYS/PACK) ×2 IMPLANT
SET TUBE SMOKE EVAC HIGH FLOW (TUBING) ×2 IMPLANT
SLEEVE ENDOPATH XCEL 5M (ENDOMECHANICALS) ×2 IMPLANT
SUT MNCRL AB 4-0 PS2 18 (SUTURE) ×4 IMPLANT
SUT VICRYL 0 UR6 27IN ABS (SUTURE) ×2 IMPLANT
SYR 20ML LL LF (SYRINGE) ×4 IMPLANT
SYS BAG RETRIEVAL 10MM (BASKET) ×1
SYSTEM BAG RETRIEVAL 10MM (BASKET) ×1 IMPLANT
TROCAR ENDO BLADELESS 11MM (ENDOMECHANICALS) ×2 IMPLANT
TROCAR XCEL NON-BLD 5MMX100MML (ENDOMECHANICALS) ×2 IMPLANT
TROCAR XCEL UNIV SLVE 11M 100M (ENDOMECHANICALS) ×2 IMPLANT
TUBE CONNECTING 12X1/4 (SUCTIONS) ×2 IMPLANT
WARMER LAPAROSCOPE (MISCELLANEOUS) ×2 IMPLANT

## 2021-05-17 NOTE — Anesthesia Postprocedure Evaluation (Signed)
Anesthesia Post Note ? ?Patient: Debra Blanchard ? ?Procedure(s) Performed: LAPAROSCOPIC CHOLECYSTECTOMY (Abdomen) ?LIVER BIOPSY (Abdomen) ? ?Patient location during evaluation: Phase II ?Anesthesia Type: General ?Level of consciousness: awake and alert and oriented ?Pain management: pain level controlled ?Vital Signs Assessment: post-procedure vital signs reviewed and stable ?Respiratory status: spontaneous breathing, nonlabored ventilation and respiratory function stable ?Cardiovascular status: blood pressure returned to baseline and stable ?Postop Assessment: no apparent nausea or vomiting ?Anesthetic complications: no ? ? ?No notable events documented. ? ? ?Last Vitals:  ?Vitals:  ? 05/17/21 0850 05/17/21 0948  ?BP:  125/68  ?Pulse:  60  ?Resp: (!) 9 14  ?Temp:  (!) 36.4 ?C  ?SpO2:  95%  ?  ?Last Pain:  ?Vitals:  ? 05/17/21 0948  ?PainSc: 1   ? ? ?  ?  ?  ?  ?  ?  ? ?Jahking Lesser C Bethanie Bloxom ? ? ? ? ?

## 2021-05-17 NOTE — Op Note (Signed)
Patient:  Debra Blanchard ? ?DOB:  [birth year] ? ?MRN:  [id number] ? ? ?Preop Diagnosis: Biliary colic, cholelithiasis, transaminitis ? ?Postop Diagnosis: Same ? ?Procedure: Laparoscopic cholecystectomy, liver biopsy ? ?Surgeon: Aviva Signs, MD ? ?Anes: General endotracheal ? ?Indications: Patient is a 51 year old white female who presents with biliary colic secondary to cholelithiasis and transaminitis.  She has been on cholesterol medication which has recently been switched.  The risks and benefits of the procedure including bleeding, infection, hepatobiliary injury, the possibility of an open procedure were fully explained to the patient, who gave informed consent. ? ?Procedure note: The patient was placed in the supine position.  After induction of general endotracheal anesthesia, the abdomen was prepped and draped using the usual sterile technique with ChloraPrep.  Surgical site confirmation was performed. ? ?A supraumbilical incision was made down to the fascia.  A Veress needle was introduced into the abdominal cavity and confirmation of placement was done using the saline drop test.  The abdomen was then insufflated to 15 mmHg pressure.  11 mm trocar was introduced into the abdominal cavity under direct visualization without difficulty.  The patient was placed in reverse Trendelenburg position and an additional 11 mm trocar was placed in the epigastric region and 5 mm trocars were placed in the right upper quadrant and right flank regions.  Liver was inspected and noted to be within normal limits.  A Tru-Cut needle biopsy of the right lobe of the liver was made and the specimen was sent to pathology for further examination.  The gallbladder was retracted in a dynamic fashion in order to provide a critical view of the triangle of [place]ot.  The cystic duct was first identified.  Its junction to the infundibulum was fully identified.  Endoclips were placed proximally and distally on the cystic duct, and the  cystic duct was divided.  This was likewise done and cystic artery.  The gallbladder was freed away from the gallbladder fossa using Bovie electrocautery.  The gallbladder was delivered through the epigastric trocar site using an Endo Catch bag.  The gallbladder fossa was inspected and no abnormal bleeding or bile leakage was noted.  Surgicel was placed in the gallbladder fossa.  The liver biopsy site was inspected and any bleeding was controlled using Bovie electrocautery.  All fluid and air were then evacuated from the abdominal cavity prior to the removal of the trocars. ? ?All wounds were irrigated with normal saline.  All wounds were injected with Exparel.  The supraumbilical fascia as well as epigastric fascia were reapproximated using 0 Vicryl interrupted sutures.  All skin incisions were closed using a 4-0 Monocryl subcuticular suture.  Dermabond was applied. ? ?All tape and needle counts were correct at the end of the procedure.  The patient was extubated in the operating room and transferred to PACU in stable condition. ? ?Complications: None ? ?EBL: Minimal ? ?Specimen: Gallbladder, liver biopsy ? ? ?  ?

## 2021-05-17 NOTE — Interval H&P Note (Signed)
History and Physical Interval Note: ? ?05/17/2021 ?7:20 AM ? ?Debra Blanchard  has presented today for surgery, with the diagnosis of Cholelithiasis.  The various methods of treatment have been discussed with the patient and family. After consideration of risks, benefits and other options for treatment, the patient has consented to  Procedure(s): ?LAPAROSCOPIC CHOLECYSTECTOMY (N/A) as a surgical intervention.  The patient's history has been reviewed, patient examined, no change in status, stable for surgery.  I have reviewed the patient's chart and labs.  Questions were answered to the patient's satisfaction.   ? ? ?Franky Macho ? ? ?

## 2021-05-17 NOTE — Transfer of Care (Signed)
Immediate Anesthesia Transfer of Care Note ? ?Patient: Debra Blanchard ? ?Procedure(s) Performed: LAPAROSCOPIC CHOLECYSTECTOMY (Abdomen) ?LIVER BIOPSY (Abdomen) ? ?Patient Location: PACU ? ?Anesthesia Type:General ? ?Level of Consciousness: awake and patient cooperative ? ?Airway & Oxygen Therapy: Patient Spontanous Breathing and Patient connected to nasal cannula oxygen ? ?Post-op Assessment: Report given to RN and Post -op Vital signs reviewed and stable ? ?Post vital signs: Reviewed and stable ? ?Last Vitals:  ?Vitals Value Taken Time  ?BP 101/84 05/17/21 0834  ?Temp 97.5   ?Pulse 61 05/17/21 0835  ?Resp 12 05/17/21 0837  ?SpO2 96 % 05/17/21 0835  ?Vitals shown include unvalidated device data. ? ?Last Pain:  ?Vitals:  ? 05/17/21 0706  ?PainSc: 0-No pain  ?   ? ?  ? ?Complications: No notable events documented. ?

## 2021-05-17 NOTE — Anesthesia Preprocedure Evaluation (Signed)
Anesthesia Evaluation  ?Patient identified by MRN, date of birth, ID band ?Patient awake ? ? ? ?Reviewed: ?Allergy & Precautions, NPO status , Patient's Chart, lab work & pertinent test results ? ?Airway ?Mallampati: II ? ?TM Distance: >3 FB ?Neck ROM: Full ? ? ? Dental ? ?(+) Dental Advisory Given, Teeth Intact ?  ?Pulmonary ?neg pulmonary ROS,  ?  ?Pulmonary exam normal ?breath sounds clear to auscultation ? ? ? ? ? ? Cardiovascular ?Exercise Tolerance: Good ?hypertension, Pt. on medications ?Normal cardiovascular exam ?Rhythm:Regular Rate:Normal ? ? ?  ?Neuro/Psych ?negative neurological ROS ? negative psych ROS  ? GI/Hepatic ?negative GI ROS, Neg liver ROS,   ?Endo/Other  ?negative endocrine ROS ? Renal/GU ?negative Renal ROS  ?negative genitourinary ?  ?Musculoskeletal ?negative musculoskeletal ROS ?(+)  ? Abdominal ?  ?Peds ?negative pediatric ROS ?(+)  Hematology ?negative hematology ROS ?(+)   ?Anesthesia Other Findings ? ? Reproductive/Obstetrics ?negative OB ROS ? ?  ? ? ? ? ? ? ? ? ? ? ? ? ? ?  ?  ? ? ? ? ? ? ? ?Anesthesia Physical ?Anesthesia Plan ? ?ASA: 2 ? ?Anesthesia Plan: General  ? ?Post-op Pain Management: Dilaudid IV  ? ?Induction: Intravenous ? ?PONV Risk Score and Plan: 4 or greater and Ondansetron, Dexamethasone and Midazolam ? ?Airway Management Planned: Oral ETT ? ?Additional Equipment:  ? ?Intra-op Plan:  ? ?Post-operative Plan: Extubation in OR ? ?Informed Consent: I have reviewed the patients History and Physical, chart, labs and discussed the procedure including the risks, benefits and alternatives for the proposed anesthesia with the patient or authorized representative who has indicated his/her understanding and acceptance.  ? ? ? ? ? ?Plan Discussed with: CRNA and Surgeon ? ?Anesthesia Plan Comments:   ? ? ? ? ? ? ?Anesthesia Quick Evaluation ? ?

## 2021-05-17 NOTE — Anesthesia Procedure Notes (Signed)
Procedure Name: Intubation ?Date/Time: 05/17/2021 7:38 AM ?Performed by: Vista Deck, CRNA ?Pre-anesthesia Checklist: Patient identified, Patient being monitored, Timeout performed, Emergency Drugs available and Suction available ?Patient Re-evaluated:Patient Re-evaluated prior to induction ?Oxygen Delivery Method: Circle system utilized ?Preoxygenation: Pre-oxygenation with 100% oxygen ?Induction Type: IV induction ?Ventilation: Mask ventilation without difficulty ?Laryngoscope Size: Mac and 3 ?Grade View: Grade I ?Tube type: Oral ?Tube size: 7.0 mm ?Number of attempts: 1 ?Airway Equipment and Method: Stylet ?Placement Confirmation: ETT inserted through vocal cords under direct vision, positive ETCO2 and breath sounds checked- equal and bilateral ?Secured at: 21 cm ?Tube secured with: Tape ?Dental Injury: Teeth and Oropharynx as per pre-operative assessment  ? ? ? ? ?

## 2021-05-18 LAB — SURGICAL PATHOLOGY

## 2021-05-19 ENCOUNTER — Encounter (HOSPITAL_COMMUNITY): Payer: Self-pay | Admitting: General Surgery

## 2021-05-21 ENCOUNTER — Telehealth: Payer: Self-pay | Admitting: *Deleted

## 2021-05-21 NOTE — Telephone Encounter (Signed)
Received call from patient [phone]~ telephone.  ? ?Surgical Date: 05/17/2021 ?Procedure: Lap Chole ? ?Concerns: Patient states that she noted rash to abdomen S/P surgery on Monday. Reports that there are tiny red dots. Denies itching, redness, blistering, drainage, etc. Does report that rash is spreading down abdomen.  ? ?Advised to use Benadryl for possible reaction. Advised to not mix Benadryl with other sedative medications such as pain medication. Advised we are unable to determine cause of reaction as she was exposed to adhesive, sterile drapes, iodine, etc in OR.  ? ?Advised if rash worsens to go to UC for evaluation. Advised if rash spreads up to chest, neck, or if SOB noted, go to ER.  ? ?Appointment scheduled with Dr. Lovell Sheehan for eval on 05/25/2021. Advised if rash resolves, she can cancel appointment if she would prefer to keep post op telephone call.  ?

## 2021-05-25 ENCOUNTER — Encounter: Payer: Self-pay | Admitting: General Surgery

## 2021-05-25 ENCOUNTER — Ambulatory Visit (INDEPENDENT_AMBULATORY_CARE_PROVIDER_SITE_OTHER): Payer: BC Managed Care – PPO | Admitting: General Surgery

## 2021-05-25 ENCOUNTER — Other Ambulatory Visit: Payer: Self-pay

## 2021-05-25 VITALS — BP 164/89 | HR 56 | Temp 98.3°F | Resp 12 | Ht 63.0 in | Wt 167.0 lb

## 2021-05-25 DIAGNOSIS — Z09 Encounter for follow-up examination after completed treatment for conditions other than malignant neoplasm: Secondary | ICD-10-CM

## 2021-05-25 NOTE — Progress Notes (Signed)
Subjective:  ?  ? Debra Blanchard  ?Patient is a 51 year old white female status post laparoscopic cholecystectomy, liver biopsy on 05/17/2021.  She states she is doing well.  She did have a rash on her abdominal wall which is slowly resolving.  She also has mild incisional pain which started 4 days after her surgery.  She denies any nausea or vomiting.  She denies any fevers. ?Objective:  ? ? BP (!) 164/89   Pulse (!) 56   Temp 98.3 ?F (36.8 ?C) (Other (Comment))   Resp 12   Ht 5\' 3"  (1.6 m)   Wt 167 lb (75.8 kg)   LMP 10/17/2016   SpO2 97%   BMI 29.58 kg/m?  ? ?General:  alert, cooperative, and no distress  ?Abdomen is soft, incisions healing well.  Minimal redness of the lower skin noted. ?Final pathology revealed mild reactive hepatocytes without fibrosis. ?   ? ?Assessment:  ? ? Doing well postoperatively. ?Resolving rash most likely secondary to ChloraPrep  ?  ?Plan:  ? ?Patient was told to follow-up with her primary care physician concerning her slightly abnormal LFTs as she has been changing her cholesterol medication.  I told her the incisional pain will resolve with time.  She is taking ibuprofen for this.  She may resume normal activity.  Follow-up here as needed. ?

## 2021-07-22 ENCOUNTER — Encounter: Payer: Self-pay | Admitting: Cardiology

## 2021-07-22 ENCOUNTER — Ambulatory Visit (INDEPENDENT_AMBULATORY_CARE_PROVIDER_SITE_OTHER): Payer: BC Managed Care – PPO | Admitting: Cardiology

## 2021-07-22 ENCOUNTER — Encounter: Payer: Self-pay | Admitting: *Deleted

## 2021-07-22 VITALS — BP 130/96 | HR 54 | Ht 64.0 in | Wt 170.0 lb

## 2021-07-22 DIAGNOSIS — R001 Bradycardia, unspecified: Secondary | ICD-10-CM

## 2021-07-22 DIAGNOSIS — R002 Palpitations: Secondary | ICD-10-CM | POA: Diagnosis not present

## 2021-07-22 DIAGNOSIS — I10 Essential (primary) hypertension: Secondary | ICD-10-CM | POA: Diagnosis not present

## 2021-07-22 MED ORDER — METOPROLOL SUCCINATE ER 25 MG PO TB24: 25.0000 mg | ORAL_TABLET | Freq: Every day | ORAL | 6 refills | Status: DC

## 2021-07-22 MED ORDER — AMLODIPINE BESYLATE 5 MG PO TABS
5.0000 mg | ORAL_TABLET | Freq: Every day | ORAL | 6 refills | Status: DC
Start: 2021-07-22 — End: 2021-08-30

## 2021-07-22 NOTE — Patient Instructions (Addendum)
.  Medication Instructions:  Begin Norvasc 5mg  daily Decrease Toprol XL to 25mg  daily Continue all other medications.     Labwork: none  Testing/Procedures: Your physician has recommended that you wear a 7 day event monitor. Event monitors are medical devices that record the heart's electrical activity. Doctors most often these monitors to diagnose arrhythmias. Arrhythmias are problems with the speed or rhythm of the heartbeat. The monitor is a small, portable device. You can wear one while you do your normal daily activities. This is usually used to diagnose what is causing palpitations/syncope (passing out). Office will contact with results via phone or letter.     Follow-Up: 6 months   Any Other Special Instructions Will Be Listed Below (If Applicable). Please call the office or send mychart message in 1 week to update on blood pressure and heart rates.    If you need a refill on your cardiac medications before your next appointment, please call your pharmacy.

## 2021-07-22 NOTE — Progress Notes (Signed)
Clinical Summary Debra Blanchard is a 51 y.o.female seen today as a new consult, referred by Dr Quillian Quince for the following medical problems.   HTN - compliant with meds - she reports on toprol 25mg  daily bp's elevated, went back to 50mg  - home bp's 120s-130s/80s-90s, HRs 49-58   2. Bradycardia - has been on toprol for several years she reports for bp   3. Palpitations  - feeling of heart skipping, occurs daily.  - often occurs at night when laying in bed - no EtoH, no caffeine    Past Medical History:  Diagnosis Date   Hyperlipidemia    Hypertension      No Known Allergies   Current Outpatient Medications  Medication Sig Dispense Refill   ezetimibe (ZETIA) 10 MG tablet Take 10 mg by mouth daily.     metoprolol succinate (TOPROL-XL) 50 MG 24 hr tablet Take 50 mg by mouth daily.     Multiple Vitamins-Minerals (WOMENS MULTIVITAMIN) TABS Take 1 tablet by mouth daily.     Omega-3 Fatty Acids (FISH OIL PO) Take 2 capsules by mouth daily.     oxyCODONE (ROXICODONE) 5 MG immediate release tablet Take 1 tablet (5 mg total) by mouth every 4 (four) hours as needed. (Patient not taking: Reported on 05/25/2021) 30 tablet 0   No current facility-administered medications for this visit.     Past Surgical History:  Procedure Laterality Date   CHOLECYSTECTOMY N/A 05/17/2021   Procedure: LAPAROSCOPIC CHOLECYSTECTOMY;  Surgeon: Aviva Signs, MD;  Location: [facility];  Service: General;  Laterality: N/A;   LIVER BIOPSY N/A 05/17/2021   Procedure: LIVER BIOPSY;  Surgeon: Aviva Signs, MD;  Location: [facility];  Service: General;  Laterality: N/A;   NO PAST SURGERIES       No Known Allergies    Family History  Problem Relation Age of Onset   Breast cancer Neg Hx      Social History Ms. Satkowski reports that she has never smoked. She has never used smokeless tobacco. Ms. Porteous reports no history of alcohol use.   Review of Systems CONSTITUTIONAL: No weight loss, fever, chills,  weakness or fatigue.  HEENT: Eyes: No visual loss, blurred vision, double vision or yellow sclerae.No hearing loss, sneezing, congestion, runny nose or sore throat.  SKIN: No rash or itching.  CARDIOVASCULAR: per hpi RESPIRATORY: No shortness of breath, cough or sputum.  GASTROINTESTINAL: No anorexia, nausea, vomiting or diarrhea. No abdominal pain or blood.  GENITOURINARY: No burning on urination, no polyuria NEUROLOGICAL: No headache, dizziness, syncope, paralysis, ataxia, numbness or tingling in the extremities. No change in bowel or bladder control.  MUSCULOSKELETAL: No muscle, back pain, joint pain or stiffness.  LYMPHATICS: No enlarged nodes. No history of splenectomy.  PSYCHIATRIC: No history of depression or anxiety.  ENDOCRINOLOGIC: No reports of sweating, cold or heat intolerance. No polyuria or polydipsia.  Marland Kitchen   Physical Examination Today's Vitals   07/22/21 1459  BP: (!) 130/96  Pulse: (!) 54  SpO2: 98%  Weight: 170 lb (77.1 kg)  Height: 5\' 4"  (1.626 m)   Body mass index is 29.18 kg/m.  Gen: resting comfortably, no acute distress HEENT: no scleral icterus, pupils equal round and reactive, no palptable cervical adenopathy,  CV: RRR, no m/r/g no jvd Resp: Clear to auscultation bilaterally GI: abdomen is soft, non-tender, non-distended, normal bowel sounds, no hepatosplenomegaly MSK: extremities are warm, no edema.  Skin: warm, no rash Neuro:  no focal deficits Psych: appropriate affect  Assessment and Plan  1.HTN - above goal, will start norvasc 5mg  daily - with low HR's wean toprol to 25mg  daily. Has some palpitations so may continue at least low dose toprol over time pending HRs - she will call and update [place] on bp and HRs in 1 week  2. Bradycardia -secondary to toprol, lower to 25mg  daily and monitor rates - would try to continue at least some toprol given palpitations if possible  3. Palpitations - obtain 7 day zio patch - EKG from pcp showes sinus  brady    F/u 6 months   Arnoldo Lenis, M.D.

## 2021-07-27 ENCOUNTER — Other Ambulatory Visit: Payer: Self-pay | Admitting: Cardiology

## 2021-07-27 ENCOUNTER — Telehealth: Payer: Self-pay | Admitting: Cardiology

## 2021-07-27 ENCOUNTER — Ambulatory Visit (INDEPENDENT_AMBULATORY_CARE_PROVIDER_SITE_OTHER): Payer: BC Managed Care – PPO

## 2021-07-27 DIAGNOSIS — R002 Palpitations: Secondary | ICD-10-CM

## 2021-07-27 NOTE — Telephone Encounter (Signed)
PERCERT: ? ?LONG TERM MONITOR  ?

## 2021-07-29 DIAGNOSIS — R002 Palpitations: Secondary | ICD-10-CM

## 2021-08-04 ENCOUNTER — Telehealth: Payer: Self-pay | Admitting: Cardiology

## 2021-08-04 NOTE — Telephone Encounter (Signed)
BP's look good, some low HRs at times. We will see how often they occur with the monitor and decide if we need to cut back the toprol further   Dominga Ferry MD

## 2021-08-04 NOTE — Telephone Encounter (Signed)
Patient informed and verbalized understanding of plan. 

## 2021-08-04 NOTE — Telephone Encounter (Signed)
Pt c/o BP issue: STAT if pt c/o blurred vision, one-sided weakness or slurred speech  1. What are your last 5 BP readings?   129/82 HR 47 06/01 141/88 HR 52 06/02 122/76 HR 55 06/03 132/85 HR 53 06/04 127/78 HR 52 06/05 133/77 HR 48 06/06    2. Are you having any other symptoms (ex. Dizziness, headache, blurred vision, passed out)? No   3. What is your BP issue?  Pt was requested by Dr Harl Bowie to keep a log of her BP and then call and report her readings.

## 2021-08-26 ENCOUNTER — Telehealth: Payer: Self-pay | Admitting: Cardiology

## 2021-08-26 NOTE — Telephone Encounter (Signed)
Pt c/o swelling: STAT is pt has developed SOB within 24 hours  How much weight have you gained and in what time span? A few lbs since the last time she saw Dr. Wyline Mood  If swelling, where is the swelling located? In ankles and some in feet. Left ankle more swollen  Are you currently taking a fluid pill? No  Are you currently SOB? No  Do you have a log of your daily weights (if so, list)? No  Have you gained 3 pounds in a day or 5 pounds in a week? No  Have you traveled recently? No

## 2021-08-27 NOTE — Telephone Encounter (Signed)
Patient thinks she may have gained 2-3 lbs but not sure due to weighing on school scales.  No c/o SOB.  Does report chest pain & palpitations only occasionally.  Patient recently started on Norvasc 5mg  daily & her Toprol decreased back to 25mg  daily.  Please also advise on her monitor results as they a back as well.

## 2021-08-30 ENCOUNTER — Telehealth: Payer: Self-pay | Admitting: *Deleted

## 2021-08-30 MED ORDER — AMLODIPINE BESYLATE 5 MG PO TABS
2.5000 mg | ORAL_TABLET | Freq: Every day | ORAL | Status: DC
Start: 2021-08-30 — End: 2021-10-05

## 2021-08-30 NOTE — Telephone Encounter (Signed)
Lesle Chris, LPN  06/04/9627  5:28 PM EDT Back to Top    Notified, copy to pcp.

## 2021-08-30 NOTE — Telephone Encounter (Signed)
Lets try norvasc 2.5mg  daily and see how that does as far as bp and swelling, Update [place] on 1 week if she could   Dominga Ferry MD

## 2021-08-30 NOTE — Telephone Encounter (Signed)
Norvasc can cause some swelling in the legs sometimes. Whether or not we need to change to something else would depend on how bad the swelling is. If just mild would consider continuing, if moderate or severe we could change. Will report monitor today  Dominga Ferry MD

## 2021-08-30 NOTE — Telephone Encounter (Signed)
Patient notified and verbalized understanding. 

## 2021-08-30 NOTE — Telephone Encounter (Signed)
Patient notified and verbalized understanding.  States that she feels like the Norvasc is really helping her blood pressure, but sometimes the swelling is moderate ("can't see my ankles sometimes").  Does not seem to be too bad now but does get worse in the evening.

## 2021-08-30 NOTE — Telephone Encounter (Signed)
-----   Message from Antoine Poche, MD sent at 08/30/2021 12:18 PM EDT ----- Monitor shows just some extra heart beats at times, sometimes can have a few in a row. These are not dangerous but can cause the symptoms of palpitatoins. Limited on medication for this given her low normal HRs, already had to lower metoprolol. Would continue current toprol dose.   Dominga Ferry MD

## 2021-09-10 ENCOUNTER — Encounter: Payer: Self-pay | Admitting: Cardiology

## 2021-09-13 NOTE — Telephone Encounter (Signed)
Please stop norvac and update [place] again in 1 week on bp's and swelling  Dominga Ferry MD

## 2021-09-27 ENCOUNTER — Encounter: Payer: Self-pay | Admitting: Cardiology

## 2021-10-05 ENCOUNTER — Other Ambulatory Visit: Payer: Self-pay | Admitting: *Deleted

## 2021-10-05 DIAGNOSIS — Z79899 Other long term (current) drug therapy: Secondary | ICD-10-CM

## 2021-10-05 DIAGNOSIS — I10 Essential (primary) hypertension: Secondary | ICD-10-CM

## 2021-10-05 MED ORDER — LOSARTAN POTASSIUM 25 MG PO TABS: 25.0000 mg | ORAL_TABLET | Freq: Every day | ORAL | 6 refills | Status: DC

## 2021-10-05 NOTE — Telephone Encounter (Signed)
Bp's are too high, if swelling better of norvasc would stay off. Would start losartan 25mg  daily as additional med for bp, check bmet 2 weeks  MD

## 2021-10-11 ENCOUNTER — Encounter: Payer: Self-pay | Admitting: Cardiology

## 2021-10-22 LAB — BASIC METABOLIC PANEL
BUN/Creatinine Ratio: 19 (ref 9–23)
BUN: 14 mg/dL (ref 6–24)
CO2: 22 mmol/L (ref 20–29)
Calcium: 9.8 mg/dL (ref 8.7–10.2)
Chloride: 104 mmol/L (ref 96–106)
Creatinine, Ser: 0.74 mg/dL (ref 0.57–1.00)
Glucose: 88 mg/dL (ref 70–99)
Potassium: 4.8 mmol/L (ref 3.5–5.2)
Sodium: 144 mmol/L (ref 134–144)
eGFR: 97 mL/min/{1.73_m2} (ref >59)

## 2021-10-28 ENCOUNTER — Other Ambulatory Visit: Payer: Self-pay | Admitting: Family Medicine

## 2021-10-28 DIAGNOSIS — Z1231 Encounter for screening mammogram for malignant neoplasm of breast: Secondary | ICD-10-CM

## 2021-11-08 ENCOUNTER — Encounter: Payer: Self-pay | Admitting: *Deleted

## 2021-12-06 ENCOUNTER — Ambulatory Visit
Admission: RE | Admit: 2021-12-06 | Discharge: 2021-12-06 | Disposition: A | Discharge status: Home / Self Care | Payer: BC Managed Care – PPO | Source: Ambulatory Visit | Attending: Family Medicine | Admitting: Family Medicine

## 2021-12-06 DIAGNOSIS — Z1231 Encounter for screening mammogram for malignant neoplasm of breast: Secondary | ICD-10-CM

## 2022-01-22 ENCOUNTER — Other Ambulatory Visit: Payer: Self-pay | Admitting: Cardiology

## 2022-01-26 ENCOUNTER — Encounter: Payer: Self-pay | Admitting: Cardiology

## 2022-01-26 ENCOUNTER — Ambulatory Visit: Payer: BC Managed Care – PPO | Attending: Cardiology | Admitting: Cardiology

## 2022-01-26 VITALS — BP 170/90 | HR 58 | Ht 64.0 in | Wt 181.8 lb

## 2022-01-26 DIAGNOSIS — R002 Palpitations: Secondary | ICD-10-CM

## 2022-01-26 DIAGNOSIS — I10 Essential (primary) hypertension: Secondary | ICD-10-CM | POA: Diagnosis not present

## 2022-01-26 DIAGNOSIS — R001 Bradycardia, unspecified: Secondary | ICD-10-CM | POA: Diagnosis not present

## 2022-01-26 NOTE — Progress Notes (Signed)
Clinical Summary Ms. Ancheta is a 51 y.o.female  HTN - compliant with meds - she reports on toprol 25mg  daily bp's elevated, went back to 50mg  - home bp's 120s-130s/80s-90s, HRs 49-58   - started norvasc, had some mild LE edema. We lowered dose from 5mg  to 2.5mg  and later stopped - started losartan 25mg  daily 10/05/21, f/u labs normal K and Cr.   - up 14 lbs since 04/2021  - seen by pcp yesterday, per her report bp 120s/70s. Reports home measurements have been 120s/70s. High stress today, just came from hospital where her mom had surgery.  - Compliant with meds      2. Bradycardia - has been on toprol for several years she reports for bp - improved with lower dose toprol     3. Palpitations  - feeling of heart skipping, occurs daily.  - often occurs at night when laying in bed - no EtoH, no caffeine  07/2021 7 day monitor: rare ectopy, 3 runs SVT longest 6 beats. Min HR 43, Max HR 154, Avg HR 59  - some recent palpitaitons still at times.   4. Chest pain - dull pain usually, left sided. 6-7/10 in severity, no other associated symptoms. Can last a full hours in a row. Occasional positional - highest levels of house work, no symptoms.      Past Medical History:  Diagnosis Date   Hyperlipidemia    Hypertension      No Known Allergies   Current Outpatient Medications  Medication Sig Dispense Refill   losartan (COZAAR) 25 MG tablet Take 1 tablet (25 mg total) by mouth daily. 30 tablet 6   metoprolol succinate (TOPROL-XL) 25 MG 24 hr tablet TAKE 1 TABLET (25 MG TOTAL) BY MOUTH DAILY. 90 tablet 1   Multiple Vitamins-Minerals (WOMENS MULTIVITAMIN) TABS Take 1 tablet by mouth daily.     Omega-3 Fatty Acids (FISH OIL PO) Take 2 capsules by mouth daily.     rosuvastatin (CRESTOR) 20 MG tablet Take 20 mg by mouth at bedtime.     No current facility-administered medications for this visit.     Past Surgical History:  Procedure Laterality Date   CHOLECYSTECTOMY  N/A 05/17/2021   Procedure: LAPAROSCOPIC CHOLECYSTECTOMY;  Surgeon: 05/2021, MD;  Location: [facility];  Service: General;  Laterality: N/A;   LIVER BIOPSY N/A 05/17/2021   Procedure: LIVER BIOPSY;  Surgeon: 01[birth year]27[birth year]1989, MD;  Location: [facility];  Service: General;  Laterality: N/A;   NO PAST SURGERIES       No Known Allergies    Family History  Problem Relation Age of Onset   Breast cancer Neg Hx      Social History Ms. Reinecke reports that she has never smoked. She has never used smokeless tobacco. Ms. Cassity reports no history of alcohol use.   Review of Systems CONSTITUTIONAL: No weight loss, fever, chills, weakness or fatigue.  HEENT: Eyes: No visual loss, blurred vision, double vision or yellow sclerae.No hearing loss, sneezing, congestion, runny nose or sore throat.  SKIN: No rash or itching.  CARDIOVASCULAR: per hpi RESPIRATORY: No shortness of breath, cough or sputum.  GASTROINTESTINAL: No anorexia, nausea, vomiting or diarrhea. No abdominal pain or blood.  GENITOURINARY: No burning on urination, no polyuria NEUROLOGICAL: No headache, dizziness, syncope, paralysis, ataxia, numbness or tingling in the extremities. No change in bowel or bladder control.  MUSCULOSKELETAL: No muscle, back pain, joint pain or stiffness.  LYMPHATICS: No enlarged nodes. No history of  splenectomy.  PSYCHIATRIC: No history of depression or anxiety.  ENDOCRINOLOGIC: No reports of sweating, cold or heat intolerance. No polyuria or polydipsia.  Marland Kitchen   Physical Examination Today's Vitals   01/26/22 1541 01/26/22 1546  BP: (!) 164/100 (!) 164/102  Pulse: (!) 58   SpO2: 98%   Weight: 181 lb 12.8 oz (82.5 kg)   Height: 5\' 4"  (1.626 m)    Body mass index is 31.21 kg/m.  Gen: resting comfortably, no acute distress HEENT: no scleral icterus, pupils equal round and reactive, no palptable cervical adenopathy,  CV: RRR, no m/r/g no jvd Resp: Clear to auscultation bilaterally GI: abdomen is soft,  non-tender, non-distended, normal bowel sounds, no hepatosplenomegaly MSK: extremities are warm, no edema.  Skin: warm, no rash Neuro:  no focal deficits Psych: appropriate affect   Diagnostic Studies   07/2021 7 day monitor 7 day monitor Rare supraventricular in the form of isolated PACs, couplets, triplets. 3 runs of SVT longest 6 beats. Rare ventricular ectopy in the form of isolated PVCs Reported symptoms correlated with sinus rhythm, PACs, short run of SVT.  Assessment and Plan  1.HTN -swelling on norvasc even low dose, tolerating losartan - reports bp's at goal by pcp and home numbers, elevated here but reports just left hospital where her mom had surgery - she will udpate 08/2021 with home bp log in 1 week, room to titrate losartan if needed   2. Bradycardia -secondary to toprol, lower to 25mg  daily and rates improved - would continue low dose given some palpitations.    3. Palpitations -monitor with benign ectopy, very short runs SVT - some ongoing symtoms, limited on beta blocker dosing given HRs - continue current meds, not enough ectopy or SVT to warrant antiarrhythmic or ablation consideration at this time.         [place], M.D.

## 2022-01-26 NOTE — Patient Instructions (Addendum)
Medication Instructions:  Your physician recommends that you continue on your current medications as directed. Please refer to the Current Medication list given to you today.   Labwork: none  Testing/Procedures: none  Follow-Up:  Your physician recommends that you schedule a follow-up appointment in: 6 months  Any Other Special Instructions Will Be Listed Below (If Applicable).  Send a MyChart message in 1 week with your blood pressure readings.  If you need a refill on your cardiac medications before your next appointment, please call your pharmacy.

## 2022-01-28 ENCOUNTER — Ambulatory Visit: Payer: BC Managed Care – PPO | Admitting: Cardiology

## 2022-02-08 ENCOUNTER — Encounter: Payer: Self-pay | Admitting: Cardiology

## 2022-02-09 NOTE — Telephone Encounter (Signed)
On average bp's too high, can she increase losartan to 50mg  daily and get a bmet 2 weeks please  MD

## 2022-02-10 ENCOUNTER — Other Ambulatory Visit: Payer: Self-pay | Admitting: *Deleted

## 2022-02-10 DIAGNOSIS — I10 Essential (primary) hypertension: Secondary | ICD-10-CM

## 2022-02-10 DIAGNOSIS — Z79899 Other long term (current) drug therapy: Secondary | ICD-10-CM

## 2022-02-10 MED ORDER — LOSARTAN POTASSIUM 50 MG PO TABS: 50.0000 mg | ORAL_TABLET | Freq: Every day | ORAL | 6 refills | Status: DC

## 2022-02-25 LAB — BASIC METABOLIC PANEL
BUN/Creatinine Ratio: 10 (ref 9–23)
BUN: 6 mg/dL (ref 6–24)
CO2: 22 mmol/L (ref 20–29)
Calcium: 9.4 mg/dL (ref 8.7–10.2)
Chloride: 105 mmol/L (ref 96–106)
Creatinine, Ser: 0.58 mg/dL (ref 0.57–1.00)
Glucose: 138 mg/dL — ABNORMAL HIGH (ref 70–99)
Potassium: 4.3 mmol/L (ref 3.5–5.2)
Sodium: 141 mmol/L (ref 134–144)
eGFR: 109 mL/min/{1.73_m2} (ref >59)

## 2022-03-07 ENCOUNTER — Encounter: Payer: Self-pay | Admitting: *Deleted

## 2022-03-11 ENCOUNTER — Encounter: Payer: Self-pay | Admitting: Cardiology

## 2022-03-14 NOTE — Telephone Encounter (Signed)
Verify taking losartan 50mg  daily, increase to 50mg  daily and update [place] on bp next week   Debra Abts MD

## 2022-03-15 NOTE — Telephone Encounter (Signed)
Im sorry, meant to say if taking 50mg  daily increase to 100mg  daily   Zandra Abts MD

## 2022-03-16 ENCOUNTER — Other Ambulatory Visit: Payer: Self-pay

## 2022-03-16 MED ORDER — LOSARTAN POTASSIUM 100 MG PO TABS: 100.0000 mg | ORAL_TABLET | Freq: Every day | ORAL | 3 refills | Status: DC

## 2022-07-20 ENCOUNTER — Ambulatory Visit: Payer: BC Managed Care – PPO | Attending: Cardiology | Admitting: Cardiology

## 2022-07-20 ENCOUNTER — Other Ambulatory Visit: Payer: Self-pay | Admitting: Cardiology

## 2022-07-20 VITALS — BP 138/80 | HR 57 | Ht 64.0 in | Wt 198.4 lb

## 2022-07-20 DIAGNOSIS — R002 Palpitations: Secondary | ICD-10-CM

## 2022-07-20 DIAGNOSIS — I10 Essential (primary) hypertension: Secondary | ICD-10-CM

## 2022-07-20 DIAGNOSIS — R0789 Other chest pain: Secondary | ICD-10-CM | POA: Diagnosis not present

## 2022-07-20 NOTE — Patient Instructions (Addendum)

## 2022-07-20 NOTE — Progress Notes (Signed)
Clinical Summary Ms. Um is a 52 y.o.female seen today for follow up of the following medical problems  HTN - started norvasc, had some mild LE edema. We lowered dose from 5mg  to 2.5mg  and later stopped  - home bp's 110-120/60-80  -home cuff 132/82 manual cuff today 138/80  2. Bradycardia - has been on toprol for several years she reports for bp - improved with lower dose toprol     3. Palpitations  - feeling of heart skipping, occurs daily.  - often occurs at night when laying in bed - no EtoH, no caffeine   07/2021 7 day monitor: rare ectopy, 3 runs SVT longest 6 beats. Min HR 43, Max HR 154, Avg HR 59   - palpitatons at times, occurs daily. Lasts just a few seconds. Overall tolerable   4. Chest pain - at a prior visit dull pain usually, left sided. 6-7/10 in severity, no other associated symptoms. Can last a full hours in a row. Occasional positional - highest levels of house work, no symptoms.    More recently isolated  Left arm pain  Lasted 5-6 hrs, varies in severity.  -father MI age 40s-50s, HTN, HLD - walks daily a few miles, no exertional symptoms.     SH: elementary Development worker, community  Past Medical History:  Diagnosis Date   Hyperlipidemia    Hypertension      No Known Allergies   Current Outpatient Medications  Medication Sig Dispense Refill   losartan (COZAAR) 100 MG tablet Take 1 tablet (100 mg total) by mouth daily. 90 tablet 3   metoprolol succinate (TOPROL-XL) 25 MG 24 hr tablet TAKE 1 TABLET (25 MG TOTAL) BY MOUTH DAILY. 90 tablet 1   Multiple Vitamins-Minerals (WOMENS MULTIVITAMIN) TABS Take 1 tablet by mouth daily.     Omega-3 Fatty Acids (FISH OIL PO) Take 2 capsules by mouth daily.     rosuvastatin (CRESTOR) 20 MG tablet Take 20 mg by mouth at bedtime.     No current facility-administered medications for this visit.     Past Surgical History:  Procedure Laterality Date   CHOLECYSTECTOMY N/A 05/17/2021   Procedure:  LAPAROSCOPIC CHOLECYSTECTOMY;  Surgeon: Franky Macho, MD;  Location: [facility];  Service: General;  Laterality: N/A;   LIVER BIOPSY N/A 05/17/2021   Procedure: LIVER BIOPSY;  Surgeon: Franky Macho, MD;  Location: [facility];  Service: General;  Laterality: N/A;   NO PAST SURGERIES       No Known Allergies    Family History  Problem Relation Age of Onset   Breast cancer Neg Hx      Social History Ms. Linford reports that she has never smoked. She has never used smokeless tobacco. Ms. [place] reports no history of alcohol use.   Review of Systems CONSTITUTIONAL: No weight loss, fever, chills, weakness or fatigue.  HEENT: Eyes: No visual loss, blurred vision, double vision or yellow sclerae.No hearing loss, sneezing, congestion, runny nose or sore throat.  SKIN: No rash or itching.  CARDIOVASCULAR: per hpi RESPIRATORY: No shortness of breath, cough or sputum.  GASTROINTESTINAL: No anorexia, nausea, vomiting or diarrhea. No abdominal pain or blood.  GENITOURINARY: No burning on urination, no polyuria NEUROLOGICAL: No headache, dizziness, syncope, paralysis, ataxia, numbness or tingling in the extremities. No change in bowel or bladder control.  MUSCULOSKELETAL: No muscle, back pain, joint pain or stiffness.  LYMPHATICS: No enlarged nodes. No history of splenectomy.  PSYCHIATRIC: No history of depression or anxiety.  ENDOCRINOLOGIC:  No reports of sweating, cold or heat intolerance. No polyuria or polydipsia.  Marland Kitchen   Physical Examination Today's Vitals   07/20/22 1608  BP: 138/80  Pulse: (!) 57  SpO2: 100%  Weight: 198 lb 6.4 oz (90 kg)  Height: 5\' 4"  (1.626 m)   Body mass index is 34.06 kg/m.  Gen: resting comfortably, no acute distress HEENT: no scleral icterus, pupils equal round and reactive, no palptable cervical adenopathy,  CV:RRR, no m/rg no jvd Resp: Clear to auscultation bilaterally GI: abdomen is soft, non-tender, non-distended, normal bowel sounds, no  hepatosplenomegaly MSK: extremities are warm, no edema.  Skin: warm, no rash Neuro:  no focal deficits Psych: appropriate affect   Diagnostic Studies 07/2021 7 day monitor 7 day monitor Rare supraventricular in the form of isolated PACs, couplets, triplets. 3 runs of SVT longest 6 beats. Rare ventricular ectopy in the form of isolated PVCs Reported symptoms correlated with sinus rhythm, PACs, short run of SVT.    Assessment and Plan  1.HTN -swelling on norvasc even low dose, tolerating losartan - home bp's at goal though slightly elevated here, her cuff is accurate based on comparison today - continue current meds    2. Bradycardia -secondary to toprol, lower to 25mg  daily and rates improved -no symptoms, have continued toprol for palpitaitons.    3. Palpitations -monitor with benign ectopy, very short runs SVT - some ongoing symtoms, limited on beta blocker dosing given HRs - symptoms tolerable, continue current meds - EKG today shows sinus brady at 57  4. Noncardiac chest pains - intermittent chest pains/left arm pains over the years not consistent with cardiac chest pain. Typically last hours at a time - monitor at this time, no indication for ischemic testing.       Antoine Poche, M.D.

## 2022-11-29 ENCOUNTER — Other Ambulatory Visit: Payer: Self-pay | Admitting: Obstetrics and Gynecology

## 2022-11-29 DIAGNOSIS — Z1231 Encounter for screening mammogram for malignant neoplasm of breast: Secondary | ICD-10-CM

## 2022-12-20 ENCOUNTER — Ambulatory Visit: Payer: BC Managed Care – PPO

## 2022-12-26 ENCOUNTER — Emergency Department (HOSPITAL_COMMUNITY): Payer: BC Managed Care – PPO

## 2022-12-26 ENCOUNTER — Other Ambulatory Visit: Payer: Self-pay

## 2022-12-26 ENCOUNTER — Emergency Department (HOSPITAL_COMMUNITY)
Admission: EM | Admit: 2022-12-26 | Discharge: 2022-12-26 | Disposition: A | Discharge status: Home / Self Care | Payer: BC Managed Care – PPO | Attending: Emergency Medicine | Admitting: Emergency Medicine

## 2022-12-26 ENCOUNTER — Encounter (HOSPITAL_COMMUNITY): Payer: Self-pay

## 2022-12-26 DIAGNOSIS — R109 Unspecified abdominal pain: Secondary | ICD-10-CM | POA: Diagnosis present

## 2022-12-26 DIAGNOSIS — I10 Essential (primary) hypertension: Secondary | ICD-10-CM | POA: Diagnosis not present

## 2022-12-26 DIAGNOSIS — Z79899 Other long term (current) drug therapy: Secondary | ICD-10-CM | POA: Insufficient documentation

## 2022-12-26 DIAGNOSIS — N200 Calculus of kidney: Secondary | ICD-10-CM | POA: Insufficient documentation

## 2022-12-26 LAB — URINALYSIS, ROUTINE W REFLEX MICROSCOPIC
Bilirubin Urine: NEGATIVE
Glucose, UA: NEGATIVE mg/dL
Ketones, ur: NEGATIVE mg/dL
Leukocytes,Ua: NEGATIVE
Nitrite: NEGATIVE
Protein, ur: NEGATIVE mg/dL
Specific Gravity, Urine: 1.014 (ref 1.005–1.030)
pH: 6 (ref 5.0–8.0)

## 2022-12-26 LAB — CBC
HCT: 37.3 % (ref 36.0–46.0)
Hemoglobin: 12.5 g/dL (ref 12.0–15.0)
MCH: 30.7 pg (ref 26.0–34.0)
MCHC: 33.5 g/dL (ref 30.0–36.0)
MCV: 91.6 fL (ref 80.0–100.0)
Platelets: 227 10*3/uL (ref 150–400)
RBC: 4.07 MIL/uL (ref 3.87–5.11)
RDW: 11.8 % (ref 11.5–15.5)
WBC: 8.7 10*3/uL (ref 4.0–10.5)
nRBC: 0 % (ref 0.0–0.2)

## 2022-12-26 LAB — PREGNANCY, URINE: Preg Test, Ur: NEGATIVE

## 2022-12-26 LAB — COMPREHENSIVE METABOLIC PANEL
ALT: 45 U/L — ABNORMAL HIGH (ref 0–44)
AST: 35 U/L (ref 15–41)
Albumin: 4.1 g/dL (ref 3.5–5.0)
Alkaline Phosphatase: 129 U/L — ABNORMAL HIGH (ref 38–126)
Anion gap: 10 (ref 5–15)
BUN: 13 mg/dL (ref 6–20)
CO2: 24 mmol/L (ref 22–32)
Calcium: 9.6 mg/dL (ref 8.9–10.3)
Chloride: 104 mmol/L (ref 98–111)
Creatinine, Ser: 0.99 mg/dL (ref 0.44–1.00)
GFR, Estimated: >60 mL/min (ref >60)
Glucose, Bld: 99 mg/dL (ref 70–99)
Potassium: 3.9 mmol/L (ref 3.5–5.1)
Sodium: 138 mmol/L (ref 135–145)
Total Bilirubin: 1 mg/dL (ref 0.3–1.2)
Total Protein: 7.6 g/dL (ref 6.5–8.1)

## 2022-12-26 LAB — LIPASE, BLOOD: Lipase: 40 U/L (ref 11–51)

## 2022-12-26 MED ORDER — OXYCODONE-ACETAMINOPHEN 5-325 MG PO TABS: 1.0000 | ORAL_TABLET | Freq: Four times a day (QID) | ORAL | 0 refills | Status: DC | PRN

## 2022-12-26 MED ORDER — TAMSULOSIN HCL 0.4 MG PO CAPS: 0.4000 mg | ORAL_CAPSULE | Freq: Every day | ORAL | 0 refills | Status: DC

## 2022-12-26 MED ORDER — KETOROLAC TROMETHAMINE 15 MG/ML IJ SOLN
15.0000 mg | Freq: Once | INTRAMUSCULAR | Status: AC
  Administered 2022-12-26: 15 mg via INTRAVENOUS
  Filled 2022-12-26: qty 1

## 2022-12-26 MED ORDER — ONDANSETRON 4 MG PO TBDP: 4.0000 mg | ORAL_TABLET | Freq: Three times a day (TID) | ORAL | 0 refills | Status: DC | PRN

## 2022-12-26 MED ORDER — OXYCODONE-ACETAMINOPHEN 5-325 MG PO TABS
1.0000 | ORAL_TABLET | Freq: Once | ORAL | Status: AC
  Administered 2022-12-26: 1 via ORAL
  Filled 2022-12-26: qty 1

## 2022-12-26 MED ORDER — IBUPROFEN 600 MG PO TABS: 600.0000 mg | ORAL_TABLET | Freq: Four times a day (QID) | ORAL | 0 refills | Status: AC | PRN

## 2022-12-26 MED ORDER — ONDANSETRON HCL 4 MG/2ML IJ SOLN
4.0000 mg | Freq: Once | INTRAMUSCULAR | Status: AC
  Administered 2022-12-26: 4 mg via INTRAVENOUS
  Filled 2022-12-26: qty 2

## 2022-12-26 NOTE — ED Notes (Signed)
Pt in bed, pt c/o L flank/back pain. Pt states that she has some urinary frequency, pt denies pain or other urinary symptoms, denies pain, odor.

## 2022-12-26 NOTE — ED Triage Notes (Signed)
C/o left flank pain radiating to abd with urinary frequency x3 days.  Denies n/v Relief with heating pad.  Tylenol w/o relief

## 2022-12-26 NOTE — ED Provider Notes (Signed)
[facility] EMERGENCY DEPARTMENT AT [facility] Provider Note   CSN: [id number] Arrival date & time: 12/26/22  1231     History  Chief Complaint  Patient presents with   Abdominal Pain    Debra Blanchard is a 52 y.o. female.  52 year old female presents to the emergency department for evaluation of left-sided flank pain.  She states that she has been experiencing flank pain since Friday which has been fairly constant.  It waxes and wanes in severity without known modifying features.  She developed some urinary frequency yesterday which has been persistent.  No specific dysuria.  She has not noted any hematuria.  Denies associated fevers, nausea, vomiting.  She has been taking Tylenol for pain without relief.  Abdominal surgical history notable for cholecystectomy.  The history is provided by the patient. No language interpreter was used.  Abdominal Pain      Home Medications Prior to Admission medications   Medication Sig Start Date End Date Taking? Authorizing Provider  ibuprofen (ADVIL) 600 MG tablet Take 1 tablet (600 mg total) by mouth every 6 (six) hours as needed (Take with food.). 12/26/22  Yes Antony Madura, PA-C  ondansetron (ZOFRAN-ODT) 4 MG disintegrating tablet Take 1 tablet (4 mg total) by mouth every 8 (eight) hours as needed for nausea or vomiting. 12/26/22  Yes Antony Madura, PA-C  oxyCODONE-acetaminophen (PERCOCET/ROXICET) 5-325 MG tablet Take 1 tablet by mouth every 6 (six) hours as needed for severe pain (pain score 7-10). 12/26/22  Yes Antony Madura, PA-C  tamsulosin (FLOMAX) 0.4 MG CAPS capsule Take 1 capsule (0.4 mg total) by mouth daily. 12/26/22  Yes Antony Madura, PA-C  Coenzyme Q10 (CO Q-10 PO) Take by mouth daily.    [provider]  losartan (COZAAR) 100 MG tablet Take 1 tablet (100 mg total) by mouth daily. 03/16/22 03/11/23  Antoine Poche, MD  metoprolol succinate (TOPROL-XL) 25 MG 24 hr tablet TAKE 1 TABLET (25 MG TOTAL) BY MOUTH  DAILY. 07/20/22   Antoine Poche, MD  Multiple Vitamins-Minerals (WOMENS MULTIVITAMIN) TABS Take 1 tablet by mouth daily. Patient not taking: Reported on 07/20/2022    [provider]  Omega-3 Fatty Acids (FISH OIL PO) Take 2 capsules by mouth daily.    [provider]  rosuvastatin (CRESTOR) 20 MG tablet Take 20 mg by mouth at bedtime. 01/25/22   [provider]      Allergies    Patient has no known allergies.    Review of Systems   Review of Systems  Gastrointestinal:  Positive for abdominal pain.  Ten systems reviewed and are negative for acute change, except as noted in the HPI.    Physical Exam Updated Vital Signs BP 130/82 (BP Location: Left Arm)   Pulse (!) 52   Temp 98 F (36.7 C) (Oral)   Resp 18   Ht 5\' 4"  (1.626 m)   Wt 81.6 kg   LMP 10/17/2016   SpO2 98%   BMI 30.90 kg/m  Physical Exam Vitals and nursing note reviewed.  Constitutional:      General: She is not in acute distress.    Appearance: She is well-developed. She is not diaphoretic.     Comments: Nontoxic appearing and in NAD  HENT:     Head: Normocephalic and atraumatic.  Eyes:     General: No scleral icterus.    Extraocular Movements: EOM normal.     Conjunctiva/sclera: Conjunctivae normal.  Cardiovascular:     Rate and Rhythm:  Normal rate and regular rhythm.     Pulses: Normal pulses.  Pulmonary:     Effort: Pulmonary effort is normal. No respiratory distress.     Comments: Respirations even and unlabored Abdominal:     Palpations: Abdomen is soft.     Tenderness: There is abdominal tenderness (L CVA).     Comments: Abdomen soft, obese. Minimal left sided TTP, though patient does have notable left CVA TTP. No peritoneal signs.  Musculoskeletal:        General: Normal range of motion.     Cervical back: Normal range of motion.  Skin:    General: Skin is warm and dry.     Coloration: Skin is not pale.     Findings: No erythema or rash.  Neurological:      Mental Status: She is alert and oriented to person, place, and time.     Coordination: Coordination normal.  Psychiatric:        Mood and Affect: Mood and affect normal.        Behavior: Behavior normal.     ED Results / Procedures / Treatments   Labs (all labs ordered are listed, but only abnormal results are displayed) Labs Reviewed  COMPREHENSIVE METABOLIC PANEL - Abnormal; Notable for the following components:      Result Value   ALT 45 (*)    Alkaline Phosphatase 129 (*)    All other components within normal limits  URINALYSIS, ROUTINE W REFLEX MICROSCOPIC - Abnormal; Notable for the following components:   Hgb urine dipstick MODERATE (*)    Bacteria, UA RARE (*)    All other components within normal limits  LIPASE, BLOOD  CBC  PREGNANCY, URINE    EKG None  Radiology CT Renal Stone Study  Result Date: 12/26/2022 CLINICAL DATA:  Left flank pain, stone suspected EXAM: CT ABDOMEN AND PELVIS WITHOUT CONTRAST TECHNIQUE: Multidetector CT imaging of the abdomen and pelvis was performed following the standard protocol without IV contrast. RADIATION DOSE REDUCTION: This exam was performed according to the departmental dose-optimization program which includes automated exposure control, adjustment of the mA and/or kV according to patient size and/or use of iterative reconstruction technique. COMPARISON:  Ultrasound 04/28/2021 FINDINGS: Lower chest: No acute abnormality. Hepatobiliary: Unremarkable liver. Cholecystectomy. No biliary dilation. Pancreas: Unremarkable. Spleen: Unremarkable. Adrenals/Urinary Tract: Normal adrenal glands. Punctate nonobstructing left calyceal stone. Mild-to-moderate left hydroureteronephrosis upstream from a 6 mm stone at the left UVJ. Asymmetric left perinephric stranding. Unremarkable right kidney and bladder. Stomach/Bowel: Normal caliber large and small bowel. No bowel wall thickening. The appendix is normal.Stomach is within normal limits.  Vascular/Lymphatic: No significant vascular findings are present. No enlarged abdominal or pelvic lymph nodes. Reproductive: Unremarkable. Other: Trace free fluid in the pelvis.  No free intraperitoneal air. Musculoskeletal: No acute fracture. IMPRESSION: 1. Obstructing 6 mm stone at the left UVJ. Electronically Signed   By: Minerva Fester M.D.   On: 12/26/2022 19:58    Procedures Procedures    Medications Ordered in ED Medications  ketorolac (TORADOL) 15 MG/ML injection 15 mg (15 mg Intravenous Given 12/26/22 1414)  ondansetron (ZOFRAN) injection 4 mg (4 mg Intravenous Given 12/26/22 1409)  oxyCODONE-acetaminophen (PERCOCET/ROXICET) 5-325 MG per tablet 1 tablet (1 tablet Oral Given 12/26/22 1601)  ketorolac (TORADOL) 15 MG/ML injection 15 mg (15 mg Intravenous Given 12/26/22 2015)  oxyCODONE-acetaminophen (PERCOCET/ROXICET) 5-325 MG per tablet 1 tablet (1 tablet Oral Given 12/26/22 2025)    ED Course/ Medical Decision Making/ A&P Clinical Course as of 12/26/22  2027  Mon Dec 26, 2022  1553 Patient with improved pain after Toradol. UA with hematuria; 11-20 WBCs. Suspect kidney stone. Creatinine preserved. Plan for CT renal stone study. [KH]  1914 Pending formal radiology interpretation of abdominal CT.  On my visualization of the imaging, there appears to be some fullness of the left renal pelvis, hydronephrosis.  Suspect obstructing uropathy. [KH]    Clinical Course User Index [KH] Antony Madura, PA-C                                 Medical Decision Making Amount and/or Complexity of Data Reviewed Labs: ordered. Radiology: ordered.  Risk Prescription drug management.   This patient presents to the ED for concern of left flank pain, this involves an extensive number of treatment options, and is a complaint that carries with it a high risk of complications and morbidity.  The differential diagnosis includes UTI vs pyelonephritis vs renal abscess vs kidney stone vs LLL pneumonia vs  ovarian cyst vs ovarian torsion vs retroperitoneal bleed.   Co morbidities that complicate the patient evaluation  HTN HLD   Additional history obtained:  Additional history obtained from husband   Lab Tests:  I Ordered, and personally interpreted labs.  The pertinent results include:  UA with 21-50 RBCs, 11-20 WBCs. No bacteriuria or nitrites.   Imaging Studies ordered:  I ordered imaging studies including CT renal study  I independently visualized and interpreted imaging which showed 6mm kidney stone at the left UVJ I agree with the radiologist interpretation   Cardiac Monitoring:  The patient was maintained on a cardiac monitor.  I personally viewed and interpreted the cardiac monitored which showed an underlying rhythm of: NSR   Medicines ordered and prescription drug management:  I ordered medication including Toradol and Percocet for pain  Reevaluation of the patient after these medicines showed that the patient improved I have reviewed the patients home medicines and have made adjustments as needed   Problem List / ED Course:  Pt has been diagnosed with a kidney stone via CT. Serum creatine WNL, vitals sign stable and the patient does not have irratractable vomiting. No associated UTI. Patient will be discharged home with pain medications & has been advised to follow up with Urology if symptoms persist.   Reevaluation:  After the interventions noted above, I reevaluated the patient and found that they have : improved   Social Determinants of Health:  Good social support; spouse at bedside   Dispostion:  After consideration of the diagnostic results and the patients response to treatment, I feel that the patent would benefit from pain control, Urology f/u. Referral given. Return precautions discussed and provided. Patient discharged in stable condition with no unaddressed concerns.          Final Clinical Impression(s) / ED Diagnoses Final  diagnoses:  Kidney stone on left side    Rx / DC Orders ED Discharge Orders          Ordered    tamsulosin (FLOMAX) 0.4 MG CAPS capsule  Daily        12/26/22 2019    ondansetron (ZOFRAN-ODT) 4 MG disintegrating tablet  Every 8 hours PRN        12/26/22 2019    oxyCODONE-acetaminophen (PERCOCET/ROXICET) 5-325 MG tablet  Every 6 hours PRN        12/26/22 2019    ibuprofen (ADVIL) 600 MG tablet  Every 6  hours PRN        12/26/22 2019              Antony Madura, PA-C 12/26/22 2027    Tanda Rockers A, DO 12/27/22 1003

## 2022-12-26 NOTE — Discharge Instructions (Addendum)
You have been found to have a 6 mm kidney stone on the left side.  This is the cause of your pain.  It is possible that you may pass this on your own, but we do advise follow-up with urology as sometimes patients require stent placement to assist with stone passage.  Take ibuprofen as prescribed for management of pain.  If pain becomes severe, take Percocet as prescribed.  Do not drive or drink alcohol after taking this medication as it may make you drowsy and impair your judgment.  Return to the ED for new or concerning symptoms.

## 2023-01-02 NOTE — Progress Notes (Unsigned)
Name: Debra Blanchard DOB: [birth year] MRN: [id number]  History of Present Illness: Debra Blanchard is a 52 y.o. female who presents today as a new patient at [facility] [place]. All available relevant medical records have been reviewed.   She reports concern of kidney stone(s).  She {Actions; denies-reports:120008} prior history of kidney stones. She {Actions; denies-reports:120008} prior history of kidney stone procedure(s) ***including ***ESWL ***ureteroscopic stone manipulation ***[facility].  Recent history: She was seen in the ER on 12/26/2022 for left flank pain. CMP with normal renal function (GFR >60; creatinine 0.99). CBC with no leukocytosis (WBC 8.7). UA showed 11-20 WBC/hpf, 21/50 RBC/hpf, bacteria (rare). CT showed a 6 mm left UVJ stone with mild-to-moderate left hydroureteronephrosis.  She was discharged with prescriptions for Flomax, Zofran, Ibuprofen 600 mg, and Percocet 5-325 mg (#15 last dispensed 12/26/2022 per PMP aware controlled substance registry).  Today: She {Actions; denies-reports:120008} stone passage. She {Actions; denies-reports:120008} flank pain or abdominal pain. She states symptoms were first noticed *** and have been ***constant / intermittent / progressively worsening / improving***.  She {Actions; denies-reports:120008} increased urinary urgency, frequency, nocturia, dysuria, gross hematuria, hesitancy, straining to void, or sensations of incomplete emptying.   Fall Screening: Do you usually have a device to assist in your mobility? {yes/no:20286} ***cane / ***walker / ***wheelchair  Medications: Current Outpatient Medications  Medication Sig Dispense Refill   Coenzyme Q10 (CO Q-10 PO) Take by mouth daily.     ibuprofen (ADVIL) 600 MG tablet Take 1 tablet (600 mg total) by mouth every 6 (six) hours as needed (Take with food.). 30 tablet 0   losartan (COZAAR) 100 MG tablet Take 1 tablet (100 mg total) by mouth daily. 90 tablet 3    metoprolol succinate (TOPROL-XL) 25 MG 24 hr tablet TAKE 1 TABLET (25 MG TOTAL) BY MOUTH DAILY. 90 tablet 1   Multiple Vitamins-Minerals (WOMENS MULTIVITAMIN) TABS Take 1 tablet by mouth daily. (Patient not taking: Reported on 07/20/2022)     Omega-3 Fatty Acids (FISH OIL PO) Take 2 capsules by mouth daily.     ondansetron (ZOFRAN-ODT) 4 MG disintegrating tablet Take 1 tablet (4 mg total) by mouth every 8 (eight) hours as needed for nausea or vomiting. 10 tablet 0   oxyCODONE-acetaminophen (PERCOCET/ROXICET) 5-325 MG tablet Take 1 tablet by mouth every 6 (six) hours as needed for severe pain (pain score 7-10). 15 tablet 0   rosuvastatin (CRESTOR) 20 MG tablet Take 20 mg by mouth at bedtime.     tamsulosin (FLOMAX) 0.4 MG CAPS capsule Take 1 capsule (0.4 mg total) by mouth daily. 7 capsule 0   No current facility-administered medications for this visit.    Allergies: No Known Allergies  Past Medical History:  Diagnosis Date   Hyperlipidemia    Hypertension    Past Surgical History:  Procedure Laterality Date   CHOLECYSTECTOMY N/A 05/17/2021   Procedure: LAPAROSCOPIC CHOLECYSTECTOMY;  Surgeon: Franky Macho, MD;  Location: [facility];  Service: General;  Laterality: N/A;   LIVER BIOPSY N/A 05/17/2021   Procedure: LIVER BIOPSY;  Surgeon: Franky Macho, MD;  Location: [facility];  Service: General;  Laterality: N/A;   NO PAST SURGERIES     Family History  Problem Relation Age of Onset   Breast cancer Neg Hx    Social History   Socioeconomic History   Marital status: Married    Spouse name: Not on file   Number of children: Not on file   Years of education: Not on file   Highest  education level: Not on file  Occupational History   Not on file  Tobacco Use   Smoking status: Never   Smokeless tobacco: Never  Substance and Sexual Activity   Alcohol use: Never   Drug use: Never   Sexual activity: Not on file  Other Topics Concern   Not on file  Social History Narrative   Not on file    Social Determinants of Health   Financial Resource Strain: Not on file  Food Insecurity: No Food Insecurity (01/10/2019)   Received from [facility], [facility]   Hunger Vital Sign    Worried About Running Out of Food in the Last Year: Never true    Ran Out of Food in the Last Year: Never true  Transportation Needs: No Transportation Needs (01/10/2019)   Received from [facility], [facility]   [facility] - Transportation    Lack of Transportation (Medical): No    Lack of Transportation (Non-Medical): No  Physical Activity: Not on file  Stress: No Stress Concern Present (01/10/2019)   Received from [facility], [facility]   [facility] - Occupational Stress Questionnaire    Feeling of Stress : Not at all  Social Connections: Not on file  Intimate Partner Violence: Not At Risk (03/03/2021)   Received from [facility], [facility]   Humiliation, Afraid, Rape, and Kick questionnaire    Fear of Current or Ex-Partner: No    Emotionally Abused: No    Physically Abused: No    Sexually Abused: No    SUBJECTIVE  Review of Systems*** Constitutional: Patient denies any unintentional weight loss or change in strength lntegumentary: Patient denies any rashes or pruritus Cardiovascular: Patient denies chest pain or syncope Respiratory: Patient denies shortness of breath Gastrointestinal: Patient ***denies nausea, vomiting, constipation, or diarrhea Musculoskeletal: Patient denies muscle cramps or weakness Neurologic: Patient denies convulsions or seizures Allergic/Immunologic: Patient denies recent allergic reaction(s) Hematologic/Lymphatic: Patient denies bleeding tendencies Endocrine: Patient denies heat/cold intolerance  GU: As per HPI.  OBJECTIVE There were no vitals filed for this visit. There is no height or weight on file to calculate BMI.  Physical Examination*** Constitutional: No obvious distress; patient is  non-toxic appearing  Cardiovascular: No visible lower extremity edema.  Respiratory: The patient does not have audible wheezing/stridor; respirations do not appear labored  Gastrointestinal: Abdomen non-distended Musculoskeletal: Normal ROM of UEs  Skin: No obvious rashes/open sores  Neurologic: CN 2-12 grossly intact Psychiatric: Answered questions appropriately with normal affect  Hematologic/Lymphatic/Immunologic: No obvious bruises or sites of spontaneous bleeding  UA: ***negative *** WBC/hpf, *** RBC/hpf, bacteria (***) PVR: *** ml  ASSESSMENT No diagnosis found.  ***We reviewed recent imaging results; ***  Advised adequate hydration and we discussed option to consider low oxalate diet given that calcium oxalate is the most common type of stone. Handout provided about stone prevention diet.  Will plan to follow up in ***6 months with ***KUB ***RUS for stone surveillance or sooner if needed. Pt verbalized understanding and agreement. All questions were answered.   PLAN Advised the following: Maintain adequate fluid intake daily. Drink citrus juice (lemon, lime or orange juice) routinely. Low oxalate diet. ***No follow-ups on file.  No orders of the defined types were placed in this encounter.   It has been explained that the patient is to follow regularly with their PCP in addition to all other providers involved in their care and to follow instructions provided by these respective  offices. Patient advised to contact urology clinic if any urologic-pertaining questions, concerns, new symptoms or problems arise in the interim period.  There are no Patient Instructions on file for this visit.  Electronically signed by: Donnita Falls, MSN, FNP-C, CUNP 01/02/2023 3:16 PM

## 2023-01-03 ENCOUNTER — Encounter: Payer: Self-pay | Admitting: Urology

## 2023-01-03 ENCOUNTER — Ambulatory Visit: Payer: BC Managed Care – PPO | Admitting: Urology

## 2023-01-03 ENCOUNTER — Ambulatory Visit (HOSPITAL_COMMUNITY)
Admission: RE | Admit: 2023-01-03 | Discharge: 2023-01-03 | Disposition: A | Discharge status: Home / Self Care | Payer: BC Managed Care – PPO | Source: Ambulatory Visit | Attending: Urology | Admitting: Urology

## 2023-01-03 VITALS — BP 114/73 | HR 73 | Temp 98.1°F

## 2023-01-03 DIAGNOSIS — Z09 Encounter for follow-up examination after completed treatment for conditions other than malignant neoplasm: Secondary | ICD-10-CM | POA: Diagnosis not present

## 2023-01-03 DIAGNOSIS — N201 Calculus of ureter: Secondary | ICD-10-CM

## 2023-01-03 DIAGNOSIS — Z87442 Personal history of urinary calculi: Secondary | ICD-10-CM | POA: Diagnosis not present

## 2023-01-03 DIAGNOSIS — N200 Calculus of kidney: Secondary | ICD-10-CM | POA: Insufficient documentation

## 2023-01-03 DIAGNOSIS — R109 Unspecified abdominal pain: Secondary | ICD-10-CM

## 2023-01-03 LAB — URINALYSIS, ROUTINE W REFLEX MICROSCOPIC
Bilirubin, UA: NEGATIVE
Glucose, UA: NEGATIVE
Ketones, UA: NEGATIVE
Nitrite, UA: NEGATIVE
Protein,UA: NEGATIVE
RBC, UA: NEGATIVE
Specific Gravity, UA: 1.025 (ref 1.005–1.030)
Urobilinogen, Ur: 0.2 mg/dL (ref 0.2–1.0)
pH, UA: 6 (ref 5.0–7.5)

## 2023-01-03 LAB — MICROSCOPIC EXAMINATION: Bacteria, UA: NONE SEEN

## 2023-01-05 ENCOUNTER — Ambulatory Visit
Admission: RE | Admit: 2023-01-05 | Discharge: 2023-01-05 | Disposition: A | Discharge status: Home / Self Care | Payer: BC Managed Care – PPO | Source: Ambulatory Visit | Attending: Obstetrics and Gynecology | Admitting: Obstetrics and Gynecology

## 2023-01-05 DIAGNOSIS — Z1231 Encounter for screening mammogram for malignant neoplasm of breast: Secondary | ICD-10-CM

## 2023-01-18 ENCOUNTER — Other Ambulatory Visit: Payer: Self-pay | Admitting: Cardiology

## 2023-01-19 ENCOUNTER — Encounter: Payer: Self-pay | Admitting: Cardiology

## 2023-01-19 ENCOUNTER — Ambulatory Visit: Payer: BC Managed Care – PPO | Attending: Cardiology | Admitting: Cardiology

## 2023-01-19 VITALS — BP 112/78 | HR 59 | Ht 64.0 in | Wt 184.0 lb

## 2023-01-19 DIAGNOSIS — R002 Palpitations: Secondary | ICD-10-CM | POA: Diagnosis not present

## 2023-01-19 DIAGNOSIS — I10 Essential (primary) hypertension: Secondary | ICD-10-CM | POA: Diagnosis not present

## 2023-01-19 DIAGNOSIS — R001 Bradycardia, unspecified: Secondary | ICD-10-CM

## 2023-01-19 NOTE — Patient Instructions (Signed)
Medication Instructions:  Continue all current medications.   Labwork: none  Testing/Procedures: none  Follow-Up: 6 months   Any Other Special Instructions Will Be Listed Below (If Applicable).   If you need a refill on your cardiac medications before your next appointment, please call your pharmacy.  

## 2023-01-19 NOTE — Progress Notes (Signed)
Clinical Summary Debra Blanchard is a 52 y.o.female seen today for follow up of the following medical problems   HTN - started norvasc, had some mild LE edema. We lowered dose from 5mg  to 2.5mg  and later stopped  - home bp's 110-120/60-80    - weight loss, 14 lbs since 06/2022 - compliant with meds    2. Bradycardia - has been on toprol for several years she reports for bp - improved with lower dose toprol     3. Palpitations  - feeling of heart skipping, occurs daily.  - often occurs at night when laying in bed - no EtoH, no caffeine   07/2021 7 day monitor: rare ectopy, 3 runs SVT longest 6 beats. Min HR 43, Max HR 154, Avg HR 59   - palpitations several times a day overall not bothersome.    4. 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     ibuprofen (ADVIL) 600 MG tablet Take 1 tablet (600 mg total) by mouth every 6 (six) hours as needed (Take with food.). 30 tablet 0   losartan (COZAAR) 100 MG tablet Take 1 tablet (100 mg total) by mouth daily. 90 tablet 3   metoprolol succinate (TOPROL-XL) 25 MG 24 hr tablet TAKE 1 TABLET (25 MG TOTAL) BY MOUTH DAILY. 90 tablet 1   Multiple Vitamins-Minerals (WOMENS MULTIVITAMIN) TABS Take 1 tablet by mouth daily. (Patient not taking: Reported on 07/20/2022)     Omega-3 Fatty Acids (FISH OIL PO) Take 2 capsules by mouth daily.     ondansetron (ZOFRAN-ODT) 4 MG disintegrating tablet Take 1 tablet (4 mg total) by mouth every 8 (eight) hours as needed for nausea or vomiting. 10 tablet 0   oxyCODONE-acetaminophen (PERCOCET/ROXICET) 5-325 MG tablet Take 1 tablet by mouth every 6 (six) hours as needed for severe pain (pain score 7-10). 15 tablet 0    rosuvastatin (CRESTOR) 20 MG tablet Take 20 mg by mouth at bedtime.     tamsulosin (FLOMAX) 0.4 MG CAPS capsule Take 1 capsule (0.4 mg total) by mouth daily. 7 capsule 0   No current facility-administered medications for this visit.     Past Surgical History:  Procedure Laterality Date   CHOLECYSTECTOMY N/A 05/17/2021   Procedure: LAPAROSCOPIC CHOLECYSTECTOMY;  Surgeon: Franky Macho, MD;  Location: [facility];  Service: General;  Laterality: N/A;   LIVER BIOPSY N/A 05/17/2021   Procedure: LIVER BIOPSY;  Surgeon: Franky Macho, MD;  Location: [facility];  Service: General;  Laterality: N/A;   NO PAST SURGERIES       No Known Allergies    Family History  Problem Relation Age of Onset   Breast cancer Neg Hx      Social History Debra Blanchard reports that she has never smoked. She has never used smokeless tobacco. Debra Blanchard reports no history of alcohol use.   Review of Systems CONSTITUTIONAL: No weight loss, fever, chills, weakness or fatigue.  HEENT: Eyes: No visual loss, blurred vision, double vision or yellow sclerae.No hearing loss, sneezing, congestion, runny nose or sore throat.  SKIN: No rash or itching.  CARDIOVASCULAR: per hpi RESPIRATORY: No shortness of  breath, cough or sputum.  GASTROINTESTINAL: No anorexia, nausea, vomiting or diarrhea. No abdominal pain or blood.  GENITOURINARY: No burning on urination, no polyuria NEUROLOGICAL: No headache, dizziness, syncope, paralysis, ataxia, numbness or tingling in the extremities. No change in bowel or bladder control.  MUSCULOSKELETAL: No muscle, back pain, joint pain or stiffness.  LYMPHATICS: No enlarged nodes. No history of splenectomy.  PSYCHIATRIC: No history of depression or anxiety.  ENDOCRINOLOGIC: No reports of sweating, cold or heat intolerance. No polyuria or polydipsia.  Marland Kitchen   Physical Examination Today's Vitals   01/19/23 1608  BP: 112/78  Pulse: (!) 59  SpO2: 99%  Weight: 184 lb 0.3 oz (83.5 kg)  Height: 5\' 4"   (1.626 m)   Body mass index is 31.59 kg/m.  Gen: resting comfortably, no acute distress HEENT: no scleral icterus, pupils equal round and reactive, no palptable cervical adenopathy,  CV: RRR, no m/rg, no jvd Resp: Clear to auscultation bilaterally GI: abdomen is soft, non-tender, non-distended, normal bowel sounds, no hepatosplenomegaly MSK: extremities are warm, no edema.  Skin: warm, no rash Neuro:  no focal deficits Psych: appropriate affect   Diagnostic Studies  07/2021 7 day monitor 7 day monitor Rare supraventricular in the form of isolated PACs, couplets, triplets. 3 runs of SVT longest 6 beats. Rare ventricular ectopy in the form of isolated PVCs Reported symptoms correlated with sinus rhythm, PACs, short run of SVT.   Assessment and Plan   1.HTN -swelling on norvasc even low dose, tolerating losartan - bp at goal, continue current meds     2. Bradycardia -secondary to toprol, lower to 25mg  daily and rates improved -continue to monitor.    3. Palpitations -monitor with benign ectopy, very short runs SVT - some ongoing symtoms, limited on beta blocker dosing given HRs - symptosm remain tolerable, if progression would need to refer to EP. Would not tolerate high av nodal agent dosing.    F/u 6 months    Antoine Poche, M.D.

## 2023-03-09 ENCOUNTER — Other Ambulatory Visit: Payer: Self-pay | Admitting: Cardiology

## 2023-03-17 IMAGING — MG MM DIGITAL SCREENING BILAT W/ TOMO AND CAD
8 series · 8 of 24 positions shown · non-contrast
Comparison: Previous exam(s).

CLINICAL DATA: Screening.

EXAM:
DIGITAL SCREENING BILATERAL MAMMOGRAM WITH TOMOSYNTHESIS AND CAD
TECHNIQUE: Bilateral screening digital craniocaudal and mediolateral oblique
mammograms were obtained. Bilateral screening digital breast
tomosynthesis was performed. The images were evaluated with
computer-aided detection.

[R MLO synth-2D]
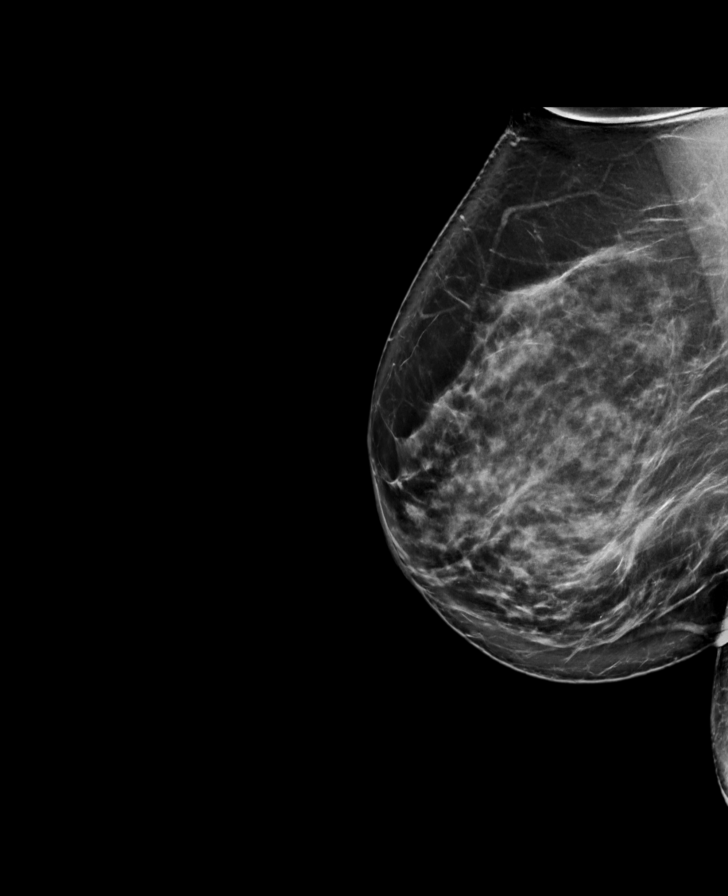

[L CC synth-2D]
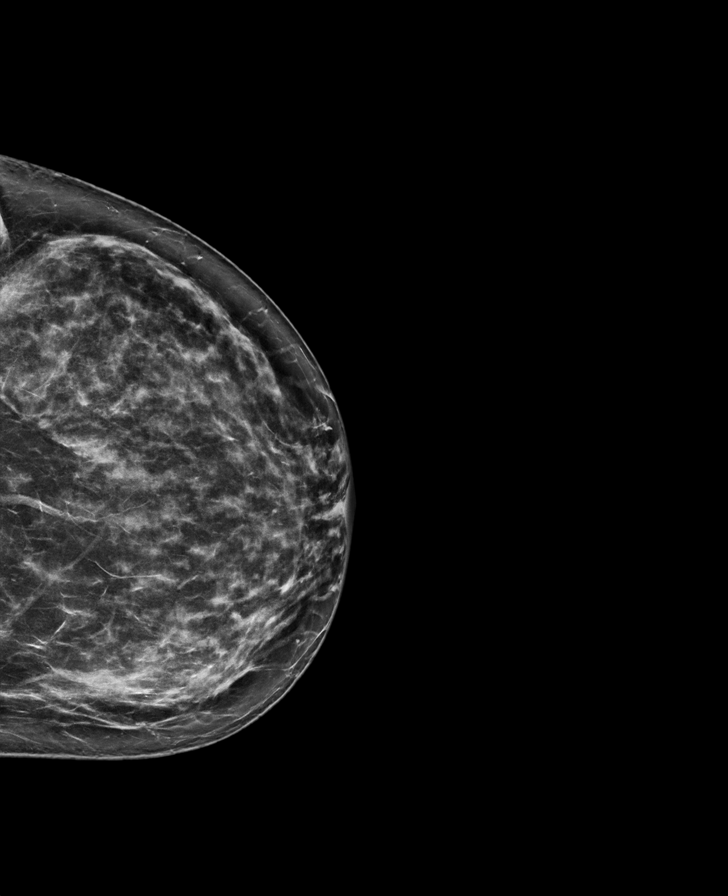

[R CC synth-2D]
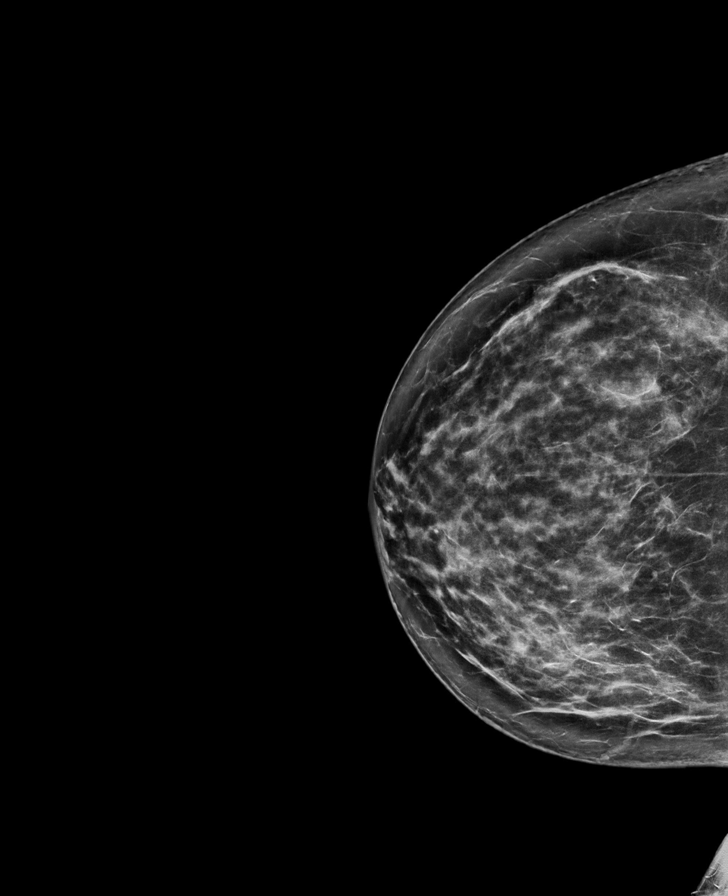

[L MLO synth-2D]
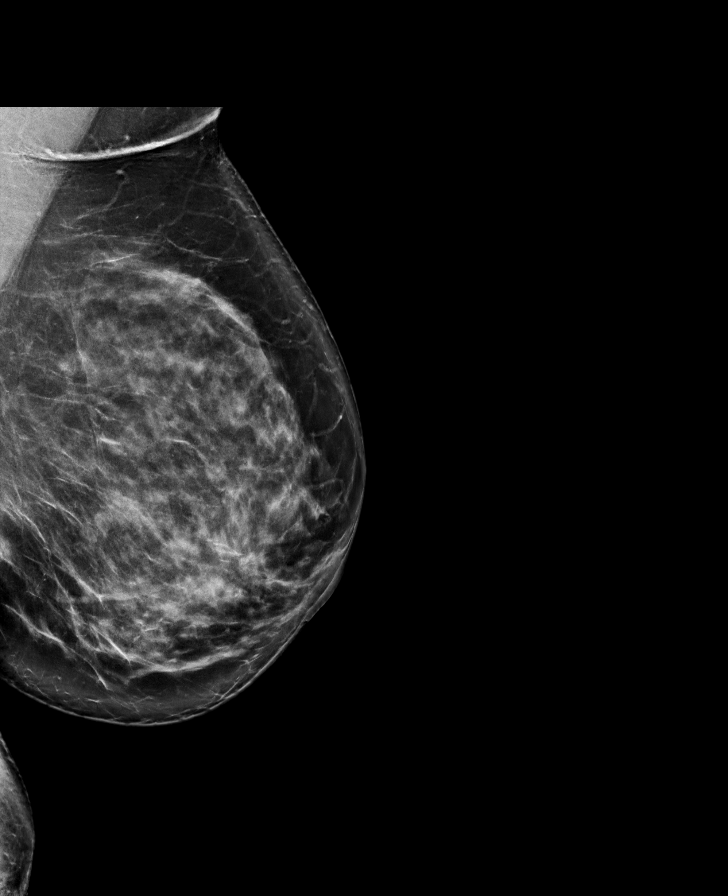

[R MLO tomo · tomo slice 50/99.0]
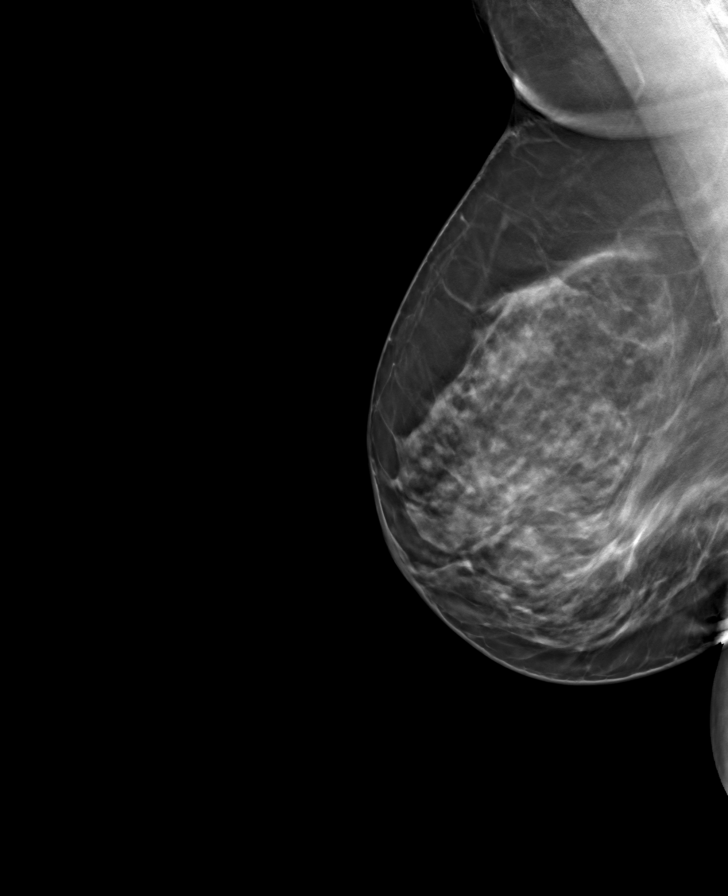

[L MLO tomo · tomo slice 49/97.0]
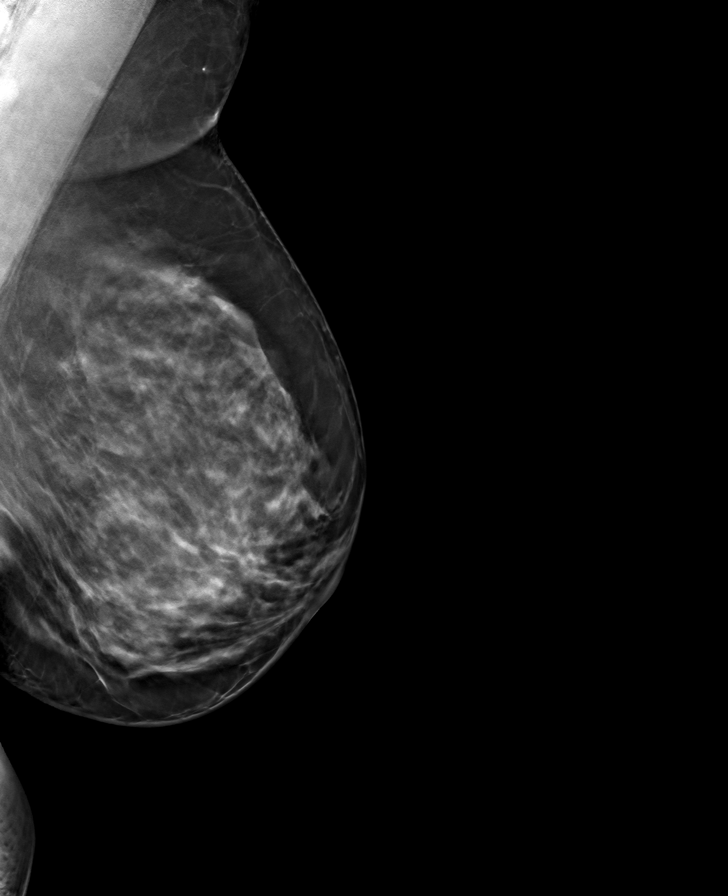

[R CC tomo · tomo slice 43/86.0]
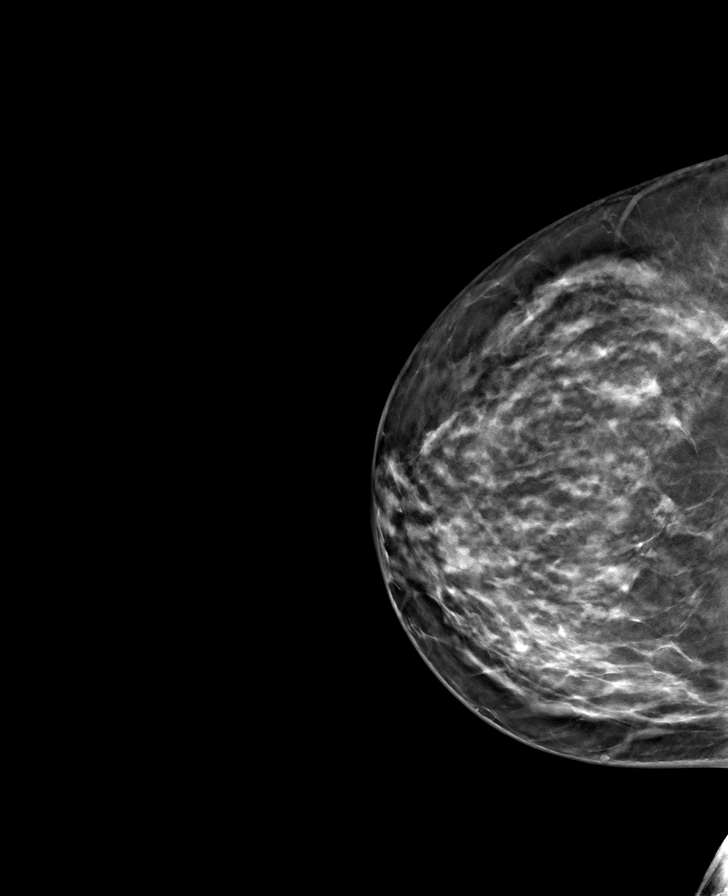

[L CC tomo · tomo slice 41/80.0]
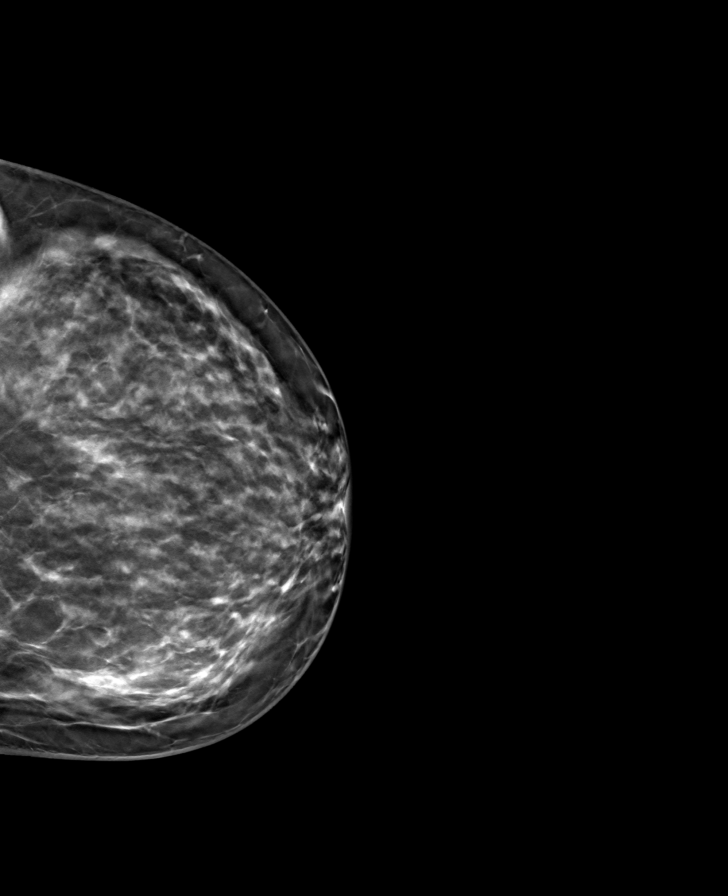

[8 of 24 positions shown; findings below may reference images not displayed]

ACR Breast Density Category c: The breast tissue is heterogeneously
dense, which may obscure small masses.
FINDINGS: There are no findings suspicious for malignancy.
IMPRESSION: No mammographic evidence of malignancy. A result letter of this
screening mammogram will be mailed directly to the patient.

RECOMMENDATION:
Screening mammogram in one year. (Code:Q3-W-BC3)

BI-RADS CATEGORY  1: Negative.

## 2023-07-04 ENCOUNTER — Ambulatory Visit: Payer: BC Managed Care – PPO | Admitting: Urology

## 2023-07-22 ENCOUNTER — Other Ambulatory Visit: Payer: Self-pay | Admitting: Cardiology

## 2023-07-27 ENCOUNTER — Encounter: Payer: Self-pay | Admitting: Cardiology

## 2023-07-27 ENCOUNTER — Ambulatory Visit: Payer: BC Managed Care – PPO | Attending: Cardiology | Admitting: Cardiology

## 2023-07-27 VITALS — BP 138/84 | HR 56 | Ht 63.0 in | Wt 200.0 lb

## 2023-07-27 DIAGNOSIS — R002 Palpitations: Secondary | ICD-10-CM | POA: Diagnosis not present

## 2023-07-27 DIAGNOSIS — I10 Essential (primary) hypertension: Secondary | ICD-10-CM

## 2023-07-27 MED ORDER — ROSUVASTATIN CALCIUM 20 MG PO TABS: 20.0000 mg | ORAL_TABLET | Freq: Every day | ORAL | 2 refills | Status: AC

## 2023-07-27 MED ORDER — METOPROLOL SUCCINATE ER 25 MG PO TB24: 25.0000 mg | ORAL_TABLET | Freq: Every day | ORAL | 2 refills | Status: AC

## 2023-07-27 MED ORDER — LOSARTAN POTASSIUM 100 MG PO TABS: 100.0000 mg | ORAL_TABLET | Freq: Every day | ORAL | 2 refills | Status: DC

## 2023-07-27 NOTE — Progress Notes (Signed)
 Clinical Summary Ms. Ofallon is a 53 y.o.female seen today for follow up of the following medical problems   HTN - started norvasc , had some mild LE edema. We lowered dose from 5mg  to 2.5mg  and later stopped  - home bp's 110-120/60-80     - weight loss, 14 lbs since 06/2022  - forgot to take meds this morning but is compliant with meds.    2. Bradycardia - has been on toprol  for several years she reports for bp - improved with lower dose toprol      3. Palpitations  - feeling of heart skipping, occurs daily.  - often occurs at night when laying in bed - no EtoH, no caffeine   07/2021 7 day monitor: rare ectopy, 3 runs SVT longest 6 beats. Min HR 43, Max HR 154, Avg HR 59   - chronic palpitations, somewhat less often than prior   4. 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     ibuprofen  (ADVIL ) 600 MG tablet Take 1 tablet (600 mg total) by mouth every 6 (six) hours as needed (Take with food.). 30 tablet 0   Omega-3 Fatty Acids (FISH OIL PO) Take 2 capsules by mouth daily.     losartan  (COZAAR ) 100 MG tablet Take 1 tablet (100 mg total) by mouth daily. 90 tablet 2   metoprolol  succinate (TOPROL -XL) 25 MG 24 hr tablet Take 1 tablet (25 mg total) by mouth daily. 90 tablet 2   rosuvastatin  (CRESTOR ) 20 MG tablet Take 1 tablet (20 mg total) by mouth daily. 90 tablet 2   No current facility-administered medications for this visit.     Past Surgical History:  Procedure Laterality Date   CHOLECYSTECTOMY N/A 05/17/2021   Procedure: LAPAROSCOPIC CHOLECYSTECTOMY;  Surgeon: Alanda Allegra, MD;  Location: [facility];  Service: General;  Laterality: N/A;   LIVER BIOPSY N/A 05/17/2021    Procedure: LIVER BIOPSY;  Surgeon: Alanda Allegra, MD;  Location: [facility];  Service: General;  Laterality: N/A;   NO PAST SURGERIES       No Known Allergies    Family History  Problem Relation Age of Onset   Breast cancer Neg Hx      Social History Ms. Sardinha reports that she has never smoked. She has never used smokeless tobacco. Ms. Sorci reports no history of alcohol use.    Physical Examination Today's Vitals   07/27/23 1624 07/27/23 1701  BP: 138/84 138/84  Pulse: (!) 56   SpO2: 99%   Weight: 200 lb (90.7 kg)   Height: 5\' 3"  (1.6 m)    Body mass index is 35.43 kg/m.  Gen: resting comfortably, no acute distress HEENT: no scleral icterus, pupils equal round and reactive, no palptable cervical adenopathy,  CV: RRR, no m/rg, no jvd Resp: Clear to auscultation bilaterally GI: abdomen is soft, non-tender, non-distended, normal bowel sounds, no hepatosplenomegaly MSK: extremities are warm, no edema.  Skin: warm, no rash Neuro:  no focal deficits Psych: appropriate affect   Diagnostic Studies 07/2021 7 day monitor 7 day monitor Rare supraventricular in the form of isolated PACs, couplets, triplets. 3 runs  of SVT longest 6 beats. Rare ventricular ectopy in the form of isolated PVCs Reported symptoms correlated with sinus rhythm, PACs, short run of SVT.    Assessment and Plan  1.HTN -swelling on norvasc  even low dose, tolerating losartan  - bp mildly elevated but has not had home meds, she reports other recent clinic visits with providrs has been well controlled - continue current meds     2. Palpitations -monitor with benign ectopy, very short runs SVT - some ongoing symtoms, limited on beta blocker dosing given HRs - symptoms tolerable. If progression would need to consider EP referral, would not tolerate high av nodal agent dosing - EKG today shows NSR   F/u 1 year.       Laurann Pollock, M.D.

## 2023-07-27 NOTE — Patient Instructions (Signed)
 Medication Instructions:   Toprol , Losartan , Crestor refilled today  Continue all other medications.     Labwork:  none  Testing/Procedures:  none  Follow-Up:  Your physician wants you to follow up in:  1 year.  You should receive a recall letter in the mail about 2 months prior to the time you are due.  If you don't receive this, please call our office to schedule your follow up appointment.      Any Other Special Instructions Will Be Listed Below (If Applicable).   If you need a refill on your cardiac medications before your next appointment, please call your pharmacy.

## 2023-07-28 ENCOUNTER — Encounter: Payer: Self-pay | Admitting: *Deleted

## 2023-07-28 ENCOUNTER — Encounter: Payer: Self-pay | Admitting: Family Medicine

## 2023-12-12 ENCOUNTER — Other Ambulatory Visit: Payer: Self-pay | Admitting: Obstetrics and Gynecology

## 2023-12-12 DIAGNOSIS — Z1231 Encounter for screening mammogram for malignant neoplasm of breast: Secondary | ICD-10-CM

## 2024-01-08 ENCOUNTER — Ambulatory Visit
Admission: RE | Admit: 2024-01-08 | Discharge: 2024-01-08 | Disposition: A | Discharge status: Home / Self Care | Source: Ambulatory Visit | Attending: Obstetrics and Gynecology | Admitting: Obstetrics and Gynecology

## 2024-01-08 DIAGNOSIS — Z1231 Encounter for screening mammogram for malignant neoplasm of breast: Secondary | ICD-10-CM

## 2024-02-14 ENCOUNTER — Encounter (HOSPITAL_BASED_OUTPATIENT_CLINIC_OR_DEPARTMENT_OTHER): Payer: Self-pay | Admitting: Internal Medicine

## 2024-02-14 DIAGNOSIS — R5383 Other fatigue: Secondary | ICD-10-CM

## 2024-02-14 DIAGNOSIS — R0683 Snoring: Secondary | ICD-10-CM

## 2024-03-05 ENCOUNTER — Other Ambulatory Visit: Payer: Self-pay | Admitting: Cardiology

## 2024-03-15 ENCOUNTER — Encounter (HOSPITAL_BASED_OUTPATIENT_CLINIC_OR_DEPARTMENT_OTHER): Payer: Self-pay | Admitting: Pulmonary Disease

## 2024-03-15 DIAGNOSIS — R0683 Snoring: Secondary | ICD-10-CM

## 2024-03-15 DIAGNOSIS — R5383 Other fatigue: Secondary | ICD-10-CM

## 2024-03-19 ENCOUNTER — Encounter (HOSPITAL_BASED_OUTPATIENT_CLINIC_OR_DEPARTMENT_OTHER): Payer: Self-pay | Admitting: Pulmonary Disease

## 2024-03-19 DIAGNOSIS — R0683 Snoring: Secondary | ICD-10-CM

## 2024-03-19 DIAGNOSIS — R5383 Other fatigue: Secondary | ICD-10-CM
